# Patient Record
Sex: Female | Born: 1990 | Race: Black or African American | Hispanic: No | Marital: Single | Smoking: Former smoker
Health system: Southern US, Community
[De-identification: ages and names within clinical notes are randomized; demographics above are authoritative.]

## PROBLEM LIST (undated history)

## (undated) ENCOUNTER — Inpatient Hospital Stay (HOSPITAL_COMMUNITY): Payer: Self-pay

## (undated) DIAGNOSIS — N760 Acute vaginitis: Secondary | ICD-10-CM

## (undated) DIAGNOSIS — I219 Acute myocardial infarction, unspecified: Secondary | ICD-10-CM

## (undated) DIAGNOSIS — F419 Anxiety disorder, unspecified: Secondary | ICD-10-CM

## (undated) DIAGNOSIS — A749 Chlamydial infection, unspecified: Secondary | ICD-10-CM

## (undated) DIAGNOSIS — R7989 Other specified abnormal findings of blood chemistry: Secondary | ICD-10-CM

## (undated) DIAGNOSIS — B9689 Other specified bacterial agents as the cause of diseases classified elsewhere: Secondary | ICD-10-CM

## (undated) HISTORY — PX: CARDIAC CATHETERIZATION: SHX172

## (undated) HISTORY — PX: WISDOM TOOTH EXTRACTION: SHX21

## (undated) HISTORY — DX: Other specified abnormal findings of blood chemistry: R79.89

## (undated) HISTORY — DX: Acute myocardial infarction, unspecified: I21.9

## (undated) HISTORY — PX: ANKLE FRACTURE SURGERY: SHX122

---
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
## 2009-01-05 ENCOUNTER — Ambulatory Visit: Payer: Self-pay | Admitting: Family Medicine

## 2009-01-12 ENCOUNTER — Ambulatory Visit: Payer: Self-pay | Admitting: Family Medicine

## 2009-01-12 ENCOUNTER — Encounter (INDEPENDENT_AMBULATORY_CARE_PROVIDER_SITE_OTHER): Payer: Self-pay | Admitting: Family Medicine

## 2009-01-12 LAB — CONVERTED CEMR LAB
Chlamydia, DNA Probe: NEGATIVE
Whiff Test: NEGATIVE

## 2009-01-13 ENCOUNTER — Encounter (INDEPENDENT_AMBULATORY_CARE_PROVIDER_SITE_OTHER): Payer: Self-pay | Admitting: Family Medicine

## 2009-09-09 ENCOUNTER — Emergency Department (HOSPITAL_COMMUNITY): Admission: EM | Admit: 2009-09-09 | Discharge: 2009-09-09 | Payer: Self-pay | Admitting: Emergency Medicine

## 2009-10-25 ENCOUNTER — Encounter (INDEPENDENT_AMBULATORY_CARE_PROVIDER_SITE_OTHER): Payer: Self-pay | Admitting: *Deleted

## 2009-10-25 DIAGNOSIS — F17200 Nicotine dependence, unspecified, uncomplicated: Secondary | ICD-10-CM

## 2010-05-19 ENCOUNTER — Ambulatory Visit: Payer: Self-pay | Admitting: Family Medicine

## 2010-05-19 ENCOUNTER — Encounter: Payer: Self-pay | Admitting: Family Medicine

## 2010-05-19 DIAGNOSIS — N760 Acute vaginitis: Secondary | ICD-10-CM | POA: Insufficient documentation

## 2010-05-19 LAB — CONVERTED CEMR LAB: Chlamydia, DNA Probe: NEGATIVE

## 2010-05-20 ENCOUNTER — Telehealth: Payer: Self-pay | Admitting: *Deleted

## 2010-05-20 LAB — CONVERTED CEMR LAB

## 2010-06-30 ENCOUNTER — Encounter: Payer: Self-pay | Admitting: Family Medicine

## 2010-06-30 DIAGNOSIS — N946 Dysmenorrhea, unspecified: Secondary | ICD-10-CM

## 2010-08-04 ENCOUNTER — Ambulatory Visit: Payer: Self-pay | Admitting: Family Medicine

## 2010-10-18 ENCOUNTER — Ambulatory Visit: Payer: Self-pay | Admitting: Family Medicine

## 2010-10-18 ENCOUNTER — Ambulatory Visit (HOSPITAL_COMMUNITY)
Admission: RE | Admit: 2010-10-18 | Discharge: 2010-10-18 | Payer: Self-pay | Source: Home / Self Care | Admitting: Family Medicine

## 2010-10-18 ENCOUNTER — Encounter: Payer: Self-pay | Admitting: Family Medicine

## 2010-10-18 DIAGNOSIS — R079 Chest pain, unspecified: Secondary | ICD-10-CM | POA: Insufficient documentation

## 2010-10-18 LAB — CONVERTED CEMR LAB: Beta hcg, urine, semiquantitative: NEGATIVE

## 2010-11-04 ENCOUNTER — Ambulatory Visit: Admit: 2010-11-04 | Payer: Self-pay

## 2010-11-12 ENCOUNTER — Emergency Department (HOSPITAL_COMMUNITY)
Admission: EM | Admit: 2010-11-12 | Discharge: 2010-11-12 | Payer: Self-pay | Source: Home / Self Care | Admitting: Emergency Medicine

## 2010-11-15 LAB — URINALYSIS, ROUTINE W REFLEX MICROSCOPIC
Bilirubin Urine: NEGATIVE
Ketones, ur: NEGATIVE mg/dL
Nitrite: NEGATIVE
Protein, ur: NEGATIVE mg/dL
Specific Gravity, Urine: 1.03 (ref 1.005–1.030)
Urine Glucose, Fasting: NEGATIVE mg/dL
Urobilinogen, UA: 0.2 mg/dL (ref 0.0–1.0)
pH: 5.5 (ref 5.0–8.0)

## 2010-11-15 LAB — POCT I-STAT, CHEM 8
BUN: 10 mg/dL (ref 6–23)
Chloride: 107 mEq/L (ref 96–112)
Creatinine, Ser: 0.8 mg/dL (ref 0.4–1.2)
Potassium: 4.7 mEq/L (ref 3.5–5.1)
Sodium: 142 mEq/L (ref 135–145)
TCO2: 26 mmol/L (ref 0–100)

## 2010-11-15 LAB — URINE MICROSCOPIC-ADD ON

## 2010-11-15 LAB — POCT PREGNANCY, URINE: Preg Test, Ur: NEGATIVE

## 2010-11-22 NOTE — Assessment & Plan Note (Signed)
Summary: depo,df   Nurse Visit   Allergies: No Known Drug Allergies  Medication Administration  Injection # 1:    Medication: Depo-Provera 150mg     Diagnosis: CONTRACEPTIVE MANAGEMENT (ICD-V25.09)    Route: IM    Site: R deltoid    Exp Date: 12/2012    Lot #: [id number]    Mfr: greenstone    Comments: next depo Dec 29 thru Jan 12 , 2012    Given by: Theresia Lo RN (August 04, 2010 11:53 AM)  Orders Added: 1)  Depo-Provera 150mg  [J1055] 2)  Admin of Injection (IM/SQ) [13244]   Medication Administration  Injection # 1:    Medication: Depo-Provera 150mg     Diagnosis: CONTRACEPTIVE MANAGEMENT (ICD-V25.09)    Route: IM    Site: R deltoid    Exp Date: 12/2012    Lot #: [id number]    Mfr: greenstone    Comments: next depo Dec 29 thru Jan 12 , 2012    Given by: Theresia Lo RN (August 04, 2010 11:53 AM)  Orders Added: 1)  Depo-Provera 150mg  [J1055] 2)  Admin of Injection (IM/SQ) [53664]

## 2010-11-22 NOTE — Assessment & Plan Note (Signed)
Summary: CPE w/ pap, STD check, Bacterial Vaginosis.    Vital Signs:  Patient profile:   20 year old female Height:      61 inches Weight:      126.1 pounds BMI:     23.91 Temp:     98.8 degrees F oral Pulse rate:   89 / minute BP sitting:   115 / 72  (left arm) Cuff size:   regular  Vitals Entered By: Garen Grams LPN (May 19, 2010 11:20 AM) CC: cpe/pap/std check/depo Is Patient Diabetic? No Pain Assessment Patient in pain? no        Primary Care Provider:  Jamie Brookes MD  CC:  cpe/pap/std check/depo.  History of Present Illness: Pt comes in for a complete physcial and STD check.  She is currently working and is planning to return to school in the fall. She is in a new relationship with a female whom she is sexually active with. She is using condoms but says that it has broken once and she wants to be tested for STD's. She is currently using Depo shots as her method of contraception but wants to think about another options as this is the 3rd year she has been on this method and there is concern for osteoporosis the longer she stays on it.  She is still smoking occasionally (bumming them off of friends but not buying packs), she is smoking MJ about 2x a day, she is getting drunk at least once a week. No concerns with illness, fevers, chills, shortness of breath, chest pain. Pt does say that she "jerks" when she is falling asleep and sometimes wakes up gasping. Has been told that she snores.   Habits & Providers  Alcohol-Tobacco-Diet     Alcohol drinks/day: <1     Tobacco Status: current     Tobacco Counseling: to quit use of tobacco products     Cigarette Packs/Day: <0.25  Exercise-Depression-Behavior     Does Patient Exercise: no     Exercise Counseling: to improve exercise regimen     STD Risk: current     STD Risk Counseling: to avoid increased STD risk     Contraception Counseling: questions answered     Drug Use: marijuanna  Comments: pt smokes MJ  2x/day  Current Medications (verified): 1)  Depo Inj. .... Once Every 3 Months  Allergies (verified): No Known Drug Allergies  Social History: Lives with grandmother.  3 cigarette/day.  EtOH 2x/week multiple drinks and getting drunk.  Marajuana 2x/day.  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 Packs/Day:  <0.25 Does Patient Exercise:  no Drug Use:  marijuanna STD Risk:  current  Review of Systems         vitals reviewed and pertinent negatives and positives seen in HPI   Physical Exam  General:  Well-developed,well-nourished,in no acute distress; alert,appropriate and cooperative throughout examination Head:  Normocephalic and atraumatic without obvious abnormalities. No apparent alopecia or balding. Eyes:  No corneal or conjunctival inflammation noted. EOMI. Perrla. Funduscopic exam benign, without hemorrhages, exudates or papilledema. Vision grossly normal. wears glasses Ears:  External ear exam shows no significant lesions or deformities.  Otoscopic examination reveals clear canals, tympanic membranes are intact bilaterally without bulging, retraction, inflammation or discharge. Hearing is grossly normal bilaterally. Nose:  External nasal examination shows no deformity or inflammation. Nasal mucosa are pink and moist without lesions or exudates. Mouth:  Oral mucosa and oropharynx without lesions or exudates.  Teeth in good repair. Neck:  No deformities, masses, or tenderness noted. Breasts:  No mass, nodules, thickening, tenderness, bulging, retraction, inflamation, nipple discharge or skin changes noted.  Bilateral nipple piercing Lungs:  Normal respiratory effort, chest expands symmetrically. Lungs are clear to auscultation, no crackles or wheezes. Heart:  Normal rate and regular rhythm. S1 and S2 normal without gallop, murmur, click, rub or other extra sounds. Abdomen:  Bowel sounds positive,abdomen soft and non-tender without masses, organomegaly or hernias noted. Genitalia:   Normal introitus for age, no external lesions, some vaginal discharge, mucosa pink and moist, no vaginal or cervical lesions, no vaginal atrophy, no friaility or hemorrhage, normal uterus size and position, no adnexal masses or tenderness Msk:  No deformity or scoliosis noted of thoracic or lumbar spine.   Extremities:  No clubbing, cyanosis, edema, or deformity noted with normal full range of motion of all joints.   Skin:  Intact without suspicious lesions or rashes, some infected leg hairs where she shaves Psych:  Cognition and judgment appear intact. Alert and cooperative with normal attention span and concentration. No apparent delusions, illusions, hallucinations   Impression & Recommendations:  Problem # 1:  HEALTH MAINTENANCE EXAM (ICD-V70.0) Assessment Unchanged No new major illnesses. Exam is normal except some vaginal discharge which was found to be bacterial vaginosis. Will plan to treat with Metronidazole.   Orders: [facility] - Est  18-39 yrs (16109)  Problem # 2:  CONTRACEPTIVE MANAGEMENT (ICD-V25.09) Assessment: Unchanged Pt is not pregnant. She recieved her Depo today. Discussed contraception. She is considering Nuvaring vs Implanon.   Orders: U Preg-[facility] (60454) Depo-Provera 150mg  (J1055) [facility] - Est  18-39 yrs (09811)  Problem # 3:  BACTERIAL VAGINITIS (ICD-616.10) Assessment: New Pt found to have bacterial vaginosis. Plan to treat with Metronidazole.   Her updated medication list for this problem includes:    Metronidazole 500 Mg Tabs (Metronidazole) .Marland Kitchen... Take all 4 pills at the same time to treat bacterial vaginosis.  Orders: Wet Prep- [facility] [phone]) GC/Chlamydia-[facility] (87591/87491) [facility] - Est  18-39 yrs (29562)  Medications Added to Medication List This Visit: 1)  Depo Inj.  .... Once every 3 months 2)  Metronidazole 500 Mg Tabs (Metronidazole) .... Take all 4 pills at the same time to treat bacterial vaginosis.  Complete Medication List: 1)  Depo Inj.  .... Once every  3 months 2)  Metronidazole 500 Mg Tabs (Metronidazole) .... Take all 4 pills at the same time to treat bacterial vaginosis.  Other Orders: RPR-[facility] ([id number]) HIV-[facility] ([id number])  Patient Instructions: 1)  Your exam today was normal. 2)  We did a wet prep and STD testing. i will call you with results.  3)  you are getting your depo shot today.  Prescriptions: METRONIDAZOLE 500 MG TABS (METRONIDAZOLE) take all 4 pills at the same time to treat bacterial vaginosis.  #4 x 0   Entered and Authorized by:   Jamie Brookes MD   Signed by:   Jamie Brookes MD on 05/20/2010   Method used:   Electronically to        [facility] [phone]* (retail)       [address]       [place], [place]  [address]       Ph: [phone]       Fax: [phone]   RxID:   [id number]    Medication Administration  Injection # 1:    Medication: Depo-Provera 150mg     Diagnosis: CONTRACEPTIVE MANAGEMENT (ICD-V25.09)    Route: IM    Site:  L deltoid    Exp Date: 01/22/2012    Lot #: [id number]    Mfr: Pfizer    Comments: Next Depo Due: October 13 - October 27    Patient tolerated injection without complications    Given by: Garen Grams LPN (May 19, 2010 12:18 PM)  Orders Added: 1)  RPR-[facility] [phone] 2)  U Preg-[facility] [81025] 3)  Depo-Provera 150mg  [J1055] 4)  Wet Prep- [facility] [87210] 5)  GC/Chlamydia-[facility] [87591/87491] 6)  HIV-[facility] [[id number]] 7)  [facility] - Est  18-39 yrs [99395]  Laboratory Results   Urine Tests  Date/Time Received: May 19, 2010 12:01 PM  Date/Time Reported: May 19, 2010 12:25 PM     Urine HCG: negative Comments: ...............test performed by......Marland KitchenBonnie A. [place], MLS (ASCP)cm  Date/Time Received: May 19, 2010 12:01 PM  Date/Time Reported: May 19, 2010 12:24 PM   Wet [place] Source: vag WBC/hpf: 10-20 Bacteria/hpf: 3+  Cocci Clue cells/hpf: many  Positive whiff Yeast/hpf: few Trichomonas/hpf: none Comments: ...............test performed  by......Marland KitchenBonnie A. [place], MLS (ASCP)cm

## 2010-11-22 NOTE — Miscellaneous (Signed)
Summary: Tobacco Selena Peterson  Clinical Lists Changes  Problems: Added new problem of TOBACCO Selena Peterson (ICD-305.1) 

## 2010-11-22 NOTE — Progress Notes (Signed)
   Phone Note Outgoing Call   Call placed by: Jimmy Footman, CMA,  May 20, 2010 1:56 PM Call placed to: Patient Summary of Call: LVM for pt to call back about her labs.Jimmy Footman, CMA  May 20, 2010 1:56 PM   Follow-up for Phone Call        called and lvm for pt to return call Follow-up by: Loralee Pacas CMA,  May 23, 2010 8:51 AM  Additional Follow-up for Phone Call Additional follow up Details #1::        Phone call completed Additional Follow-up by: Jimmy Footman, CMA,  May 23, 2010 9:26 AM

## 2010-11-22 NOTE — Miscellaneous (Signed)
Summary: c/o prolonged period on Depo   Clinical Lists Changes states she has bled or spotted x 4 weeks. had depo 05/19/10. wants it to stop. told her the depo is in her system ans some women have this when they first get depo. her cycle should get better or she might have no bleeding after 1 or 2 more shots.  next shot due 10/13. told her she can either get it again & that this should even out & stop or may choose another method. to pcp. told pt I will call her if md wants her to come in sooner.Golden Circle RN  June 30, 2010 10:42 AM  If the flow is heavy enough that she is starting to become anemic this is a problem. Otherwise I agree with your plan. If the patient begins to feel weak, SOB or fatigued have her come in to get a CBC. I will put a future order for a CBC in case she starts feeling that way. Please let her know.  Jamie Brookes MD  July 01, 2010 2:31 PM  LM.Marland KitchenGolden Circle RN  July 04, 2010 10:22 AM  pt returned call De Nurse  July 04, 2010 10:33 AM  states the bleeding has stopped. asked that if she gets any symptoms as above, call us. when she is here for next depo, make an appt with md to discuss. she agreed.Golden Circle RN  July 04, 2010 11:34 AM  Problems: Added new problem of IRREGULAR MENSES (ICD-626.4) Orders: Added new Test order of CBC-[facility] (16109) - Signed

## 2010-11-23 ENCOUNTER — Encounter: Payer: Self-pay | Admitting: *Deleted

## 2010-11-24 NOTE — Assessment & Plan Note (Signed)
Summary: chest pain, IMPLANON INSERTION, ankle rash   Vital Signs:  Patient profile:   20 year old female Weight:      129 pounds Temp:     98.4 degrees F oral Pulse rate:   85 / minute Pulse rhythm:   regular BP sitting:   132 / 84  (left arm) Cuff size:   regular  Vitals Entered By: Loralee Pacas CMA (October 18, 2010 9:56 AM)  Primary Care Provider:  Jamie Brookes MD  CC:  Chest pain, implanon, and ankle rash. .  History of Present Illness: Chest pain: Pt has been having chest pain on and off for the last 1 month. She says it hurts worse when she is at work and when she caves her chest inward. She is not sleeping differently, she is not stressed more than usual at work. She sits at her job and has not been exercising more than normal. She says she was worried that it was being caused by the black and mild cigars she has been smoking so she has cut back on those in the last 1 week and has not had any chest pain in the last 1 week. No fevers, coughs, or chills. Pt is not currently having chest pain.   Implanon: Dominence of hand: Right Placement of Implanon: left Placement determined using ruler. insertion was 8 cm from medial epicondyle. marks made on skin at site of insertion.  skin was cleaned w/ alcohol, 5 cc of 1% lidocaine w/o epi was inserted under skin.  the skin was prepped using 3 betadine swabs sterile prodedure was used.  The implanon was inserted without complication. 2 Steri strips placed once skin was prepped w/ Benzine tincture Compression bandage was placed Implanon palpated by myself & patient  Rash on Ankle: Pt has a rash on her ankles that has been there on and off for 15 years. She has tried Triamcinolone cream for the rash but says that it only gets rid of the redness and itching but the rash still is present. She admits to popping the vesicles that arise from the rash. Wants referral to Derm.    Habits & Providers  Alcohol-Tobacco-Diet     Tobacco  Status: current     Tobacco Counseling: to quit use of tobacco products  Current Medications (verified): 1)  Implanon 68 Mg Impl (Etonogestrel) .... Inserted 10-18-10, To Be Removed 10-18-2013  Allergies (verified): No Known Drug Allergies  Review of Systems        vitals reviewed and pertinent negatives and positives seen in HPI   Physical Exam  General:  Well-developed,well-nourished,in no acute distress; alert,appropriate and cooperative throughout examination Head:  Normocephalic and atraumatic without obvious abnormalities. No apparent alopecia or balding. Eyes:  No corneal or conjunctival inflammation noted. EOMI. Perrla.  Vision grossly normal. Lungs:  Normal respiratory effort, chest expands symmetrically. Lungs are clear to auscultation, no crackles or wheezes. Heart:  Normal rate and regular rhythm. S1 and S2 normal without gallop, murmur, click, rub or other extra sounds. Msk:  left arm placement for implanon marked based on biceps/triceps  groove   Impression & Recommendations:  Problem # 1:  CHEST PAIN UNSPECIFIED (ICD-786.50) Assessment New Pt has had chest pain as she was smoking more Black and Milds. She has slowed on her smoking the last 1 week and has not had any chest pain the last 1 week. She had an EKG today. rate 61, normal axis, sinus rhythm, no T wave abnormalities, final overread to  come.  I suspect this is due to chest pain from early bronchitis (pt had some occasional coughing) created by her smoking. Advised not to smoke.   Orders: 12 Lead EKG (12 Lead EKG) [facility]- Est  Level 4 (56213)  Problem # 2:  SKIN RASH (ICD-782.1) Assessment: Deteriorated Pt says she has had this for 15 years. She would like to see a dermatologist. I agree. This will likely need a biopsy but it is in a very thin skinned area and I will defer to derm to do the biopsy.   Derm Referral  Problem # 3:  CONTRACEPTIVE MANAGEMENT (ICD-V25.09) Assessment: New Implanon inserted today  after consent obtained. See procedure note for specifics.   Orders: U Preg-[facility] (08657) Insertion, non biodegradable drug deliver implant, (84696)  Complete Medication List: 1)  Implanon 68 Mg Impl (Etonogestrel) .... Inserted 10-18-10, to be removed 10-18-2013  Other Orders: Dermatology Referral [facility])  Patient Instructions: 1)  Pt given verbal instructions: 2)  take pressure dressing off in 2 hours, don't get it wet today, can wash with regular soap tomorrow, leave steri-strips on till they fall off. Call with with questions or concerns.    Orders Added: 1)  U Preg-[facility] [81025] 2)  12 Lead EKG [12 Lead EKG] 3)  Dermatology Referral [Derma] 4)  [facility]- Est  Level 4 [29528] 5)  Insertion, non biodegradable drug deliver implant, [41324]    Laboratory Results   Urine Tests  Date/Time Received: October 18, 2010 10:18 AM  Date/Time Reported: October 18, 2010 10:25 AM     Urine HCG: negative Comments: ...............test performed by......Marland KitchenBonnie A. [place], MLS (ASCP)cm     Appended Document: Orders Update     Clinical Lists Changes  Problems: Added new problem of INSERTION OF IMPLANTABLE SUBDERMAL CONTRACEPTIVE (ICD-V25.5) Added new problem of SURVEILLANCE PREV PRSC IMPL SUBDERMAL CONTRACEPT (ICD-V25.43)

## 2010-11-24 NOTE — Miscellaneous (Signed)
Summary: Consent: implanon insertion  Consent: implanon insertion   Imported By: Knox Royalty 10/25/2010 16:04:13  _____________________________________________________________________  External Attachment:    Type:   Image     Comment:   External Document

## 2011-06-13 ENCOUNTER — Ambulatory Visit: Payer: Self-pay | Admitting: Family Medicine

## 2011-07-17 ENCOUNTER — Ambulatory Visit: Payer: Self-pay | Admitting: Family Medicine

## 2011-08-17 ENCOUNTER — Emergency Department (HOSPITAL_COMMUNITY)
Admission: EM | Admit: 2011-08-17 | Discharge: 2011-08-17 | Disposition: A | Discharge status: Home / Self Care | Payer: Self-pay | Attending: Emergency Medicine | Admitting: Emergency Medicine

## 2011-08-17 DIAGNOSIS — W57XXXA Bitten or stung by nonvenomous insect and other nonvenomous arthropods, initial encounter: Secondary | ICD-10-CM | POA: Insufficient documentation

## 2011-08-17 DIAGNOSIS — R21 Rash and other nonspecific skin eruption: Secondary | ICD-10-CM | POA: Insufficient documentation

## 2011-08-17 DIAGNOSIS — L299 Pruritus, unspecified: Secondary | ICD-10-CM | POA: Insufficient documentation

## 2011-08-17 DIAGNOSIS — T148 Other injury of unspecified body region: Secondary | ICD-10-CM | POA: Insufficient documentation

## 2011-10-25 ENCOUNTER — Telehealth (HOSPITAL_COMMUNITY): Payer: Self-pay | Admitting: Emergency Medicine

## 2011-10-25 ENCOUNTER — Emergency Department (INDEPENDENT_AMBULATORY_CARE_PROVIDER_SITE_OTHER)
Admission: EM | Admit: 2011-10-25 | Discharge: 2011-10-25 | Disposition: A | Discharge status: Home / Self Care | Payer: Self-pay | Source: Home / Self Care | Attending: Emergency Medicine | Admitting: Emergency Medicine

## 2011-10-25 ENCOUNTER — Encounter: Payer: Self-pay | Admitting: Emergency Medicine

## 2011-10-25 DIAGNOSIS — N390 Urinary tract infection, site not specified: Secondary | ICD-10-CM

## 2011-10-25 DIAGNOSIS — R300 Dysuria: Secondary | ICD-10-CM

## 2011-10-25 HISTORY — DX: Chlamydial infection, unspecified: A74.9

## 2011-10-25 HISTORY — DX: Other specified bacterial agents as the cause of diseases classified elsewhere: N76.0

## 2011-10-25 HISTORY — DX: Other specified bacterial agents as the cause of diseases classified elsewhere: B96.89

## 2011-10-25 LAB — POCT URINALYSIS DIP (DEVICE)
Bilirubin Urine: NEGATIVE
Ketones, ur: NEGATIVE mg/dL
Protein, ur: NEGATIVE mg/dL
Specific Gravity, Urine: 1.015 (ref 1.005–1.030)
pH: 6 (ref 5.0–8.0)

## 2011-10-25 LAB — WET PREP, GENITAL
Trich, Wet Prep: NONE SEEN
Yeast Wet Prep HPF POC: NONE SEEN

## 2011-10-25 MED ORDER — SULFAMETHOXAZOLE-TRIMETHOPRIM 800-160 MG PO TABS: 1.0000 | ORAL_TABLET | Freq: Two times a day (BID) | ORAL | Status: AC

## 2011-10-25 MED ORDER — PHENAZOPYRIDINE HCL 200 MG PO TABS: 200.0000 mg | ORAL_TABLET | Freq: Three times a day (TID) | ORAL | Status: AC | PRN

## 2011-10-25 NOTE — ED Notes (Signed)
Did not assist with pelvic exam

## 2011-10-25 NOTE — ED Provider Notes (Signed)
History     CSN: [id number]  Arrival date & time 10/25/11  8469   First MD Initiated Contact with Patient 10/25/11 1103      Chief Complaint  Patient presents with  . Urinary Tract Infection    (Consider location/radiation/quality/duration/timing/severity/associated sxs/prior treatment) HPI Comments: Pt with  urgency, frequency, dysuria starting last night. Lower abd pressure at end of urinary stream.  States that she holds her urine and did not urinate after intercourse for the last 2 times. No vaginal discharge, oderous urine, hematuria,  genital blisters, vaginal itching. No fevers, N/V, back pain. No recent abx use. Pt sexually active with same female partner who is asxatic.  does not use condoms. Would like to be checked for STD's as well. . H/o chlamydia, BV.  No h/o UTI, gonorrhea  trichmonoas yeast infection. No h/o syphilis, herpes, HIV.    Patient is a 21 y.o. female presenting with urinary tract infection.  Urinary Tract Infection Pertinent negatives include no abdominal pain.    Past Medical History  Diagnosis Date  . Chlamydia   . BV (bacterial vaginosis)     Past Surgical History  Procedure Date  . Ankle fracture surgery     History reviewed. No pertinent family history.  History  Substance Use Topics  . Smoking status: Current Everyday Smoker  . Smokeless tobacco: Not on file  . Alcohol Use: Yes    OB History    Grav Para Term Preterm Abortions TAB SAB Ect Mult Living                  Review of Systems  Constitutional: Negative for fever.  Gastrointestinal: Negative for nausea, vomiting and abdominal pain.  Genitourinary: Positive for dysuria, urgency and frequency. Negative for hematuria, flank pain, vaginal bleeding, vaginal discharge and vaginal pain.  Musculoskeletal: Negative for back pain.  Skin: Negative for rash.    Allergies  Review of patient's allergies indicates no known allergies.  Home Medications   Current Outpatient Rx  Name  Route Sig Dispense Refill  . ETONOGESTREL 68 MG [place] IMPL  Inserted 10-18-10, to be removed 10-18-2013     . PHENAZOPYRIDINE HCL 200 MG PO TABS Oral Take 1 tablet (200 mg total) by mouth 3 (three) times daily as needed for pain. 6 tablet 0  . SULFAMETHOXAZOLE-TRIMETHOPRIM 800-160 MG PO TABS Oral Take 1 tablet by mouth 2 (two) times daily. 6 tablet 0    BP 121/68  Pulse 86  Temp(Src) 98.2 F (36.8 C) (Oral)  Resp 18  SpO2 100%  LMP 10/12/2011  Physical Exam  Nursing note and vitals reviewed. Constitutional: She is oriented to person, place, and time. She appears well-developed and well-nourished. No distress.  HENT:  Head: Normocephalic and atraumatic.  Eyes: EOM are normal. Pupils are equal, round, and reactive to light.  Neck: Normal range of motion. Neck supple.  Cardiovascular: Normal rate, regular rhythm and normal heart sounds.   Pulmonary/Chest: Effort normal and breath sounds normal.  Abdominal: Soft. Bowel sounds are normal. She exhibits no distension. There is no tenderness. There is no rebound, no guarding and no CVA tenderness.  Genitourinary: Uterus normal. Pelvic exam was performed with patient supine. There is no rash on the right labia. There is no rash on the left labia. Uterus is not tender. Cervix exhibits no motion tenderness and no friability. Right adnexum displays no mass, no tenderness and no fullness. Left adnexum displays no mass, no tenderness and no fullness. No erythema, tenderness or bleeding  around the vagina. No foreign body around the vagina.       nonoderous Yellowish brown vaginal d/c. Pt states just finshed menses .Chaperone present during exam  Musculoskeletal: Normal range of motion.  Neurological: She is alert and oriented to person, place, and time.  Skin: Skin is warm and dry.  Psychiatric: She has a normal mood and affect. Her behavior is normal. Judgment and thought content normal.    ED Course  Procedures (including critical care time)  Labs  Reviewed  POCT URINALYSIS DIP (DEVICE) - Abnormal; Notable for the following:    Hgb urine dipstick SMALL (*)    Nitrite POSITIVE (*)    Leukocytes, UA TRACE (*) Biochemical Testing Only. Please order routine urinalysis from main lab if confirmatory testing is needed.   All other components within normal limits  POCT PREGNANCY, URINE  POCT PREGNANCY, URINE  POCT URINALYSIS DIPSTICK  WET PREP, GENITAL  GC/CHLAMYDIA PROBE AMP, GENITAL   No results found.   1. UTI (lower urinary tract infection)       MDM  Previous chart, labs, imaging reviewed. As noted in HPI  H&P most c/w  UTI. Sent off GC/chlamydia, wet prep. Will not treat empirically now. Will send home with abx for uti. Advised pt to refrain from sexual contact until she  knows lab results, symptoms resolve, and partner(s) are treated if necessary. Pt provided working phone number. Pt agrees.      Luiz Blare, MD 10/25/11 1243

## 2011-10-25 NOTE — ED Notes (Signed)
Pt. verified and notified of results. Rx. called as ordered to [facility] on [address] ( see telephone encounter).

## 2011-10-25 NOTE — ED Notes (Signed)
   Luiz Blare, MD More Detail >>      Luiz Blare, MD        Sent: Wed October 25, 2011  5:10 PM    To: Vassie Moselle, RN        NATHIFA RITTHALER    MRN: [id number] DOB: [birth year]     Pt Work: [phone] Pt Home: [phone]           Message     I cant figure out how to do a telephone order. pls call in flagyl 500 mg po bid x 7 days no refills Thanks  copied message from Dr. Ozella Rocks, Desiree Lucy 10/25/2011

## 2011-10-25 NOTE — Telephone Encounter (Signed)
Message copied by Domenick Gong on Wed Oct 25, 2011  5:09 PM ------      Message from: Vassie Moselle      Created: Wed Oct 25, 2011  4:44 PM       Error prev. note. It was mod. clue cells, WBC's TNTC.      Cherly Anderson M

## 2011-10-25 NOTE — ED Notes (Signed)
Noticed pressure at the end of urinary stream noticed yesterday.  Has increased water intake and pressure has improved.  Patient also concerned for std.  Denies vaginal discharge.

## 2011-10-26 NOTE — ED Notes (Signed)
GC neg., Chlamydia neg., Wet prep: mod. Clue cells, WBC's TNTC.

## 2012-04-17 ENCOUNTER — Ambulatory Visit: Payer: Self-pay | Admitting: Family Medicine

## 2012-04-22 ENCOUNTER — Ambulatory Visit: Payer: Self-pay | Admitting: Family Medicine

## 2012-07-15 ENCOUNTER — Encounter: Payer: Self-pay | Admitting: Family Medicine

## 2012-07-15 ENCOUNTER — Ambulatory Visit (INDEPENDENT_AMBULATORY_CARE_PROVIDER_SITE_OTHER): Payer: Medicaid Other | Admitting: Family Medicine

## 2012-07-15 ENCOUNTER — Other Ambulatory Visit (HOSPITAL_COMMUNITY)
Admission: RE | Admit: 2012-07-15 | Discharge: 2012-07-15 | Disposition: A | Discharge status: Home / Self Care | Payer: Medicaid Other | Source: Ambulatory Visit | Attending: Family Medicine | Admitting: Family Medicine

## 2012-07-15 VITALS — BP 137/85 | HR 81 | Temp 98.9°F | Ht 60.0 in | Wt 142.5 lb

## 2012-07-15 DIAGNOSIS — Z9189 Other specified personal risk factors, not elsewhere classified: Secondary | ICD-10-CM

## 2012-07-15 DIAGNOSIS — Z124 Encounter for screening for malignant neoplasm of cervix: Secondary | ICD-10-CM

## 2012-07-15 DIAGNOSIS — N760 Acute vaginitis: Secondary | ICD-10-CM

## 2012-07-15 DIAGNOSIS — Z202 Contact with and (suspected) exposure to infections with a predominantly sexual mode of transmission: Secondary | ICD-10-CM | POA: Insufficient documentation

## 2012-07-15 DIAGNOSIS — Z2089 Contact with and (suspected) exposure to other communicable diseases: Secondary | ICD-10-CM

## 2012-07-15 DIAGNOSIS — Z01419 Encounter for gynecological examination (general) (routine) without abnormal findings: Secondary | ICD-10-CM | POA: Insufficient documentation

## 2012-07-15 LAB — POCT WET PREP (WET MOUNT): WBC, Wet Prep HPF POC: >20

## 2012-07-15 NOTE — Assessment & Plan Note (Addendum)
Will check wet prep, GC/Chlamydia, HIV and RPR today. Counseled patient on condom use at all times.  Handout given. Due for pap today also.  Patient to schedule annual physical at her earliest convenience.

## 2012-07-15 NOTE — Progress Notes (Signed)
  Subjective:    Selena Peterson is a 21 y.o. female who presents for sexually transmitted disease check. Sexual history reviewed with the patient. STI Exposure: she has had unprotected sex with one partner in the last 2 months, but has not had any STD screening since she has been with him.  Patient has an Implanon, but does not always wear condoms.  Current symptoms vaginal discharge: white and thin.  She denies any irregular bleeding, pelvic pain, or pain with intercourse.  She denies any fever, chills, NS, nausea or vomiting.     Contraception: Implanon   Review of Systems:  Per HPI   Objective:    BP 137/85  Pulse 81  Temp 98.9 F (37.2 C) (Oral)  Ht 5' (1.524 m)  Wt 142 lb 8 oz (64.638 kg)  BMI 27.83 kg/m2 General:   alert, cooperative and no distress  Abdomen:   soft, NT, ND, no pain on palpation  Pelvis:  Vulva and vagina appear normal. Bimanual exam reveals normal uterus and adnexa. Vaginal: normal without tenderness, induration or masses and discharge, white, copious Cervix: normal appearance  Cultures:  GC and Chlamydia genprobes and wet prep     Assessment:    Possible STD exposure, vaginal discharge    Plan:    Discussed safe sexual practice in detail.  See Problem List

## 2012-07-15 NOTE — Patient Instructions (Addendum)
It was nice to meet you, Keaisha. We will call or send a letter with your lab results. ALWAYS wear condoms to prevent sexually transmitted diseases. Please return to clinic if you develop fever, chills, nausea/vomiting, or worsening symptoms. Come back for a complete physical at your earliest convenience.  Sexually Transmitted Disease A sexually transmitted disease (STD) is an infection that is passed from person to person during sexual activity. STDs can be spread by different types of germs (bacteria, viruses, parasites). An STD can be passed through:  Spit (saliva).   Semen.   Blood.   Mucus from the vagina.   Pee (urine).  HOME CARE   Tell your sex partner(s) that you have an STD. They should be tested and treated.   Take your medicine (antibiotics) as told. Finish them even if you start to feel better.   Only take medicines as told by your doctor.   Rest.   Eat a healthy diet. Drink enough fluids to keep your pee clear or pale yellow.   Do not have sex until treatment is finished. You must follow up with your doctor.   Keep all doctor visits, Pap tests, and blood tests as told by your doctor.   Only use condoms labeled "latex" and lubricants that wash away with water (water-soluble). Do not use petroleum jelly or oils.   Avoid alcohol and illegal drugs.   Get shots (vaccines) for HPV and hepatitis.   Avoid risky sex behavior that can break the skin.  GET HELP RIGHT AWAY IF:  You have a fever.   You have new problems, or your problems get worse.  MAKE SURE YOU:  Understand these instructions.   Will watch your condition.   Will get help right away if you are not doing well or get worse.  Document Released: 11/16/2004 Document Revised: 09/28/2011 Document Reviewed: 02/06/2011 [facility] Patient Information 2012 [place], [place].

## 2012-07-16 ENCOUNTER — Encounter: Payer: Medicaid Other | Admitting: Family Medicine

## 2012-07-16 LAB — HIV ANTIBODY (ROUTINE TESTING W REFLEX): HIV: NONREACTIVE

## 2012-07-23 ENCOUNTER — Telehealth: Payer: Self-pay | Admitting: Family Medicine

## 2012-07-23 NOTE — Telephone Encounter (Signed)
Please call patient and let her know that ALL results are NORMAL.  Thanks.

## 2012-07-23 NOTE — Telephone Encounter (Signed)
Spoke with patient and informed her that all the labs are normal

## 2012-08-28 ENCOUNTER — Ambulatory Visit: Payer: Medicaid Other | Admitting: Family Medicine

## 2012-09-02 ENCOUNTER — Telehealth: Payer: Self-pay | Admitting: Family Medicine

## 2012-09-02 NOTE — Telephone Encounter (Signed)
Patient is calling to find out when she is supposed to have the Emplanon removed.  She didn't know if it would be coming January or the next.

## 2012-09-02 NOTE — Telephone Encounter (Signed)
Was unable to speak with pt. Should she call please inform pt that she can have this removed 10/18/2013.Selena Peterson [place]

## 2012-09-03 NOTE — Telephone Encounter (Signed)
LVM for patient to call back. ?

## 2012-09-12 ENCOUNTER — Encounter: Payer: Medicaid Other | Admitting: Family Medicine

## 2012-09-24 ENCOUNTER — Encounter: Payer: Medicaid Other | Admitting: Family Medicine

## 2012-11-12 ENCOUNTER — Encounter: Payer: Self-pay | Admitting: Family Medicine

## 2012-11-12 ENCOUNTER — Telehealth: Payer: Self-pay | Admitting: Family Medicine

## 2012-11-12 ENCOUNTER — Ambulatory Visit (INDEPENDENT_AMBULATORY_CARE_PROVIDER_SITE_OTHER): Payer: Medicaid Other | Admitting: Family Medicine

## 2012-11-12 VITALS — BP 145/91 | HR 81 | Temp 97.8°F | Wt 143.0 lb

## 2012-11-12 DIAGNOSIS — N926 Irregular menstruation, unspecified: Secondary | ICD-10-CM

## 2012-11-12 DIAGNOSIS — R030 Elevated blood-pressure reading, without diagnosis of hypertension: Secondary | ICD-10-CM

## 2012-11-12 LAB — POCT URINE PREGNANCY: Preg Test, Ur: NEGATIVE

## 2012-11-12 MED ORDER — IBUPROFEN 800 MG PO TABS: 800.0000 mg | ORAL_TABLET | Freq: Three times a day (TID) | ORAL | Status: DC | PRN

## 2012-11-12 NOTE — Progress Notes (Signed)
  Subjective:    Patient ID: Selena Peterson, female    DOB: 1991-03-10, 22 y.o.   MRN: [id number]  HPI  21yo with implanon placed two years ago here because concerned abotu irregular menses  Had not had trouble with menses first two years of implanon.  In past few months has noted two periods per month with increased cramping.  Periods are prlonged and may last as much as 2 weeks. Improved with ibuprofen 800 mg.  No vagianl duscharge or urinary symptoms.  Review of Systems    see HPI Objective:   Physical Exam GEN: NAD Abd:  Soft, nontender. Arm:  Left upper arm implanon in place       Assessment & Plan:

## 2012-11-12 NOTE — Patient Instructions (Addendum)
Implanon due for replacement after 10/18/2013  It is common to have times of irregular periods with implanon.  This is not worrisome and will go away with time.  Will check your hemoglobin today  Will prescribed high dose ibuprofen to take for cramping and bleeding.  Cardiovascular exercise, weight loss, and a low salt diet can help you prevent complications from high blood pressure.  Normal blood pressure is less than 140/90

## 2012-11-12 NOTE — Assessment & Plan Note (Addendum)
Discussed irregular bleeding is normal with implanon.  Pregnancy test and hgb today are normal.  Advised ibuprofen 800 mg scheduled for first few days for cramping and bleeding, then prn. She declines STD screening as she feels she has had it recently.

## 2012-11-12 NOTE — Telephone Encounter (Signed)
Was supposed to have tylenol called in for her and pharmacy says they don't have it yet.  pls advise

## 2012-11-12 NOTE — Assessment & Plan Note (Addendum)
Patient would like to know how to improve as this runs in her family and she is typically has borderlien BP's.  Advised weight loss, exercise, low salt diet.  Advsied will continue to monitor at subsequent office visits.

## 2012-11-12 NOTE — Telephone Encounter (Signed)
Spoke with patient and informed of below 

## 2012-11-12 NOTE — Telephone Encounter (Signed)
Advised patient ibuprofen was sent in to pharmacy.

## 2013-02-11 ENCOUNTER — Emergency Department (HOSPITAL_COMMUNITY)
Admission: EM | Admit: 2013-02-11 | Discharge: 2013-02-11 | Disposition: A | Discharge status: Home / Self Care | Payer: Medicaid Other | Attending: Emergency Medicine | Admitting: Emergency Medicine

## 2013-02-11 ENCOUNTER — Encounter (HOSPITAL_COMMUNITY): Payer: Self-pay | Admitting: *Deleted

## 2013-02-11 DIAGNOSIS — F17200 Nicotine dependence, unspecified, uncomplicated: Secondary | ICD-10-CM | POA: Insufficient documentation

## 2013-02-11 DIAGNOSIS — R61 Generalized hyperhidrosis: Secondary | ICD-10-CM | POA: Insufficient documentation

## 2013-02-11 DIAGNOSIS — M2550 Pain in unspecified joint: Secondary | ICD-10-CM | POA: Insufficient documentation

## 2013-02-11 DIAGNOSIS — IMO0001 Reserved for inherently not codable concepts without codable children: Secondary | ICD-10-CM | POA: Insufficient documentation

## 2013-02-11 DIAGNOSIS — I10 Essential (primary) hypertension: Secondary | ICD-10-CM | POA: Insufficient documentation

## 2013-02-11 DIAGNOSIS — J3501 Chronic tonsillitis: Secondary | ICD-10-CM | POA: Insufficient documentation

## 2013-02-11 DIAGNOSIS — Z8619 Personal history of other infectious and parasitic diseases: Secondary | ICD-10-CM | POA: Insufficient documentation

## 2013-02-11 DIAGNOSIS — R509 Fever, unspecified: Secondary | ICD-10-CM | POA: Insufficient documentation

## 2013-02-11 DIAGNOSIS — J039 Acute tonsillitis, unspecified: Secondary | ICD-10-CM

## 2013-02-11 LAB — GLUCOSE, CAPILLARY: Glucose-Capillary: 97 mg/dL (ref 70–99)

## 2013-02-11 MED ORDER — AMOXICILLIN 500 MG PO CAPS: 500.0000 mg | ORAL_CAPSULE | Freq: Three times a day (TID) | ORAL | Status: DC

## 2013-02-11 NOTE — ED Provider Notes (Signed)
History     CSN: [id number]  Arrival date & time 02/11/13  0915   First MD Initiated Contact with Patient 02/11/13 [phone]      Chief Complaint  Patient presents with  . Sore Throat    (Consider location/radiation/quality/duration/timing/severity/associated sxs/prior treatment) HPI Comments: Selena Peterson is a 22 y.o. Female who presents for evaluation of sore throat. She has moderate pain in her throat. It hurts worse with swallowing. She has associated fever, chills, diaphoresis, myalgias and arthralgias. She did not take any medication for the problem. She denies headache, weakness, dizziness. She's had a single episode of vomiting. She was unable to work today because of the discomfort. She has a history of borderline elevated blood pressure.  Patient is a 22 y.o. female presenting with pharyngitis. The history is provided by the patient.  Sore Throat    Past Medical History  Diagnosis Date  . Chlamydia   . BV (bacterial vaginosis)     Past Surgical History  Procedure Laterality Date  . Ankle fracture surgery      History reviewed. No pertinent family history.  History  Substance Use Topics  . Smoking status: Current Every Day Smoker -- 0.30 packs/day    Types: Cigarettes  . Smokeless tobacco: Not on file  . Alcohol Use: Yes    OB History   Grav Para Term Preterm Abortions TAB SAB Ect Mult Living                  Review of Systems  All other systems reviewed and are negative.    Allergies  Review of patient's allergies indicates no known allergies.  Home Medications   Current Outpatient Rx  Name  Route  Sig  Dispense  Refill  . aspirin 325 MG tablet   Oral   Take 325 mg by mouth every 4 (four) hours as needed for pain.         Marland Kitchen DM-Phenylephrine-Acetaminophen (TYLENOL COLD MULTI-SYMPTOM) 10-5-325 MG/15ML LIQD   Oral   Take 15-30 mLs by mouth every 6 (six) hours as needed (cough).         . Etonogestrel (IMPLANON) 68 MG IMPL   Subcutaneous  Inject 1 each into the skin once. Inserted 10-18-10, to be removed 10-18-2013         . amoxicillin (AMOXIL) 500 MG capsule   Oral   Take 1 capsule (500 mg total) by mouth 3 (three) times daily.   30 capsule   0   . diphenhydrAMINE (BENADRYL) 25 MG tablet   Oral   Take 25 mg by mouth every 6 (six) hours as needed for itching or allergies.           BP 150/98  Pulse 105  Temp(Src) 98.4 F (36.9 C) (Oral)  Resp 16  SpO2 100%  Physical Exam  Nursing note and vitals reviewed. Constitutional: She is oriented to person, place, and time. She appears well-developed and well-nourished.  HENT:  Head: Normocephalic and atraumatic.  Bilateral tonsillar hypertrophy with exudate. No peritonsillar swelling. Airway is intact.  Eyes: Conjunctivae and EOM are normal. Pupils are equal, round, and reactive to light.  Neck: Normal range of motion and phonation normal. Neck supple.  Cardiovascular: Normal rate, regular rhythm and intact distal pulses.   Pulmonary/Chest: Effort normal and breath sounds normal. She exhibits no tenderness.  No stridor  Abdominal: Soft. She exhibits no distension. There is no tenderness. There is no guarding.  Musculoskeletal: Normal range of motion.  Neurological: She  is alert and oriented to person, place, and time. She has normal strength. She exhibits normal muscle tone.  Skin: Skin is warm and dry.  Psychiatric: She has a normal mood and affect. Her behavior is normal. Judgment and thought content normal.    ED Course  Procedures (including critical care time)    Vitals with Age-Percentiles 10/25/2011 07/15/2012 11/12/2012 02/11/2013  Height percentile      Systolic percentile      Diastolic percentile      Length  152.4 cm    Systolic 121 137 409 150  Diastolic 68 85 91 98  Pulse 86 81 81 105  Respiration 18   16  Weight  64.638 kg 64.864 kg   VISIT REPORT          Nursing Notes Reviewed/ Care Coordinated, and agree without changes. Applicable  Imaging Reviewed.  Interpretation of Laboratory Data incorporated into ED treatment   1. Tonsillitis   2. Hypertension       MDM  Clinical tonsillitis, with no complicating features. Mild elevation associated with painful condition. I did not suspect hypertensive urgency. Doubt metabolic instability, serious bacterial infection or impending vascular collapse; the patient is stable for discharge.    Plan: Home Medications- Amoxicillin ; Home Treatments- rest, fluids; Recommended follow up- PCP for check up in 2 weeks          Flint Melter, MD 02/11/13 [phone]

## 2013-02-11 NOTE — ED Notes (Addendum)
Pt reports being dehydrated starting Sunday. Sore throat and flu like symptoms, sore throat, chills, joint pain, vomited once last night. Pain 8/10.  Pt reports having feelings of urinary frequency. Has moments when pt feels like she is going to pass out.

## 2013-03-05 ENCOUNTER — Encounter: Payer: Self-pay | Admitting: *Deleted

## 2013-03-05 ENCOUNTER — Ambulatory Visit (INDEPENDENT_AMBULATORY_CARE_PROVIDER_SITE_OTHER): Payer: PRIVATE HEALTH INSURANCE | Admitting: Emergency Medicine

## 2013-03-05 VITALS — BP 138/70 | HR 98 | Temp 97.9°F | Resp 16 | Ht 61.5 in | Wt 145.2 lb

## 2013-03-05 DIAGNOSIS — J018 Other acute sinusitis: Secondary | ICD-10-CM

## 2013-03-05 DIAGNOSIS — K219 Gastro-esophageal reflux disease without esophagitis: Secondary | ICD-10-CM

## 2013-03-05 MED ORDER — AMOXICILLIN-POT CLAVULANATE 875-125 MG PO TABS: 1.0000 | ORAL_TABLET | Freq: Two times a day (BID) | ORAL | Status: DC

## 2013-03-05 MED ORDER — PSEUDOEPHEDRINE-GUAIFENESIN ER 60-600 MG PO TB12: 1.0000 | ORAL_TABLET | Freq: Two times a day (BID) | ORAL | Status: AC

## 2013-03-05 MED ORDER — ESOMEPRAZOLE MAGNESIUM 40 MG PO CPDR: 40.0000 mg | DELAYED_RELEASE_CAPSULE | Freq: Every day | ORAL | Status: DC

## 2013-03-05 NOTE — Patient Instructions (Addendum)
Diet for Gastroesophageal Reflux Disease, Adult  Reflux (acid reflux) is when acid from your stomach flows up into the esophagus. When acid comes in contact with the esophagus, the acid causes irritation and soreness (inflammation) in the esophagus. When reflux happens often or so severely that it causes damage to the esophagus, it is called gastroesophageal reflux disease (GERD). Nutrition therapy can help ease the discomfort of GERD.  FOODS OR DRINKS TO AVOID OR LIMIT  · Smoking or chewing tobacco. Nicotine is one of the most potent stimulants to acid production in the gastrointestinal tract.  · Caffeinated and decaffeinated coffee and black tea.  · Regular or low-calorie carbonated beverages or energy drinks (caffeine-free carbonated beverages are allowed).    · Strong spices, such as black pepper, white pepper, red pepper, cayenne, curry powder, and chili powder.  · Peppermint or spearmint.  · Chocolate.  · High-fat foods, including meats and fried foods. Extra added fats including oils, butter, salad dressings, and nuts. Limit these to less than 8 tsp per day.  · Fruits and vegetables if they are not tolerated, such as citrus fruits or tomatoes.  · Alcohol.  · Any food that seems to aggravate your condition.  If you have questions regarding your diet, call your caregiver or a registered dietitian.  OTHER THINGS THAT MAY HELP GERD INCLUDE:   · Eating your meals slowly, in a relaxed setting.  · Eating 5 to 6 small meals per day instead of 3 large meals.  · Eliminating food for a period of time if it causes distress.  · Not lying down until 3 hours after eating a meal.  · Keeping the head of your bed raised 6 to 9 inches (15 to 23 cm) by using a foam wedge or blocks under the legs of the bed. Lying flat may make symptoms worse.  · Being physically active. Weight loss may be helpful in reducing reflux in overweight or obese adults.  · Wear loose fitting clothing  EXAMPLE MEAL PLAN  This meal plan is approximately  2,000 calories based on ChooseMyPlate.gov meal planning guidelines.  Breakfast  · ½ cup cooked oatmeal.  · 1 cup strawberries.  · 1 cup low-fat milk.  · 1 oz almonds.  Snack  · 1 cup cucumber slices.  · 6 oz yogurt (made from low-fat or fat-free milk).  Lunch  · 2 slice whole-wheat bread.  · 2½ oz sliced turkey.  · 2 tsp mayonnaise.  · 1 cup blueberries.  · 1 cup snap peas.  Snack  · 6 whole-wheat crackers.  · 1 oz string cheese.  Dinner  · ½ cup brown rice.  · 1 cup mixed veggies.  · 1 tsp olive oil.  · 3 oz grilled fish.  Document Released: 10/09/2005 Document Revised: 01/01/2012 Document Reviewed: 08/25/2011  ExitCare® Patient Information ©2013 [employer].  Gastroesophageal Reflux Disease, Adult  Gastroesophageal reflux disease (GERD) happens when acid from your stomach flows up into the esophagus. When acid comes in contact with the esophagus, the acid causes soreness (inflammation) in the esophagus. Over time, GERD may create small holes (ulcers) in the lining of the esophagus.  CAUSES   · Increased body weight. This puts pressure on the stomach, making acid rise from the stomach into the esophagus.  · Smoking. This increases acid production in the stomach.  · Drinking alcohol. This causes decreased pressure in the lower esophageal sphincter (valve or ring of muscle between the esophagus and stomach), allowing acid from the stomach   into the esophagus.  · Late evening meals and a full stomach. This increases pressure and acid production in the stomach.  · A malformed lower esophageal sphincter.  Sometimes, no cause is found.  SYMPTOMS   · Burning pain in the lower part of the mid-chest behind the breastbone and in the mid-stomach area. This may occur twice a week or more often.  · Trouble swallowing.  · Sore throat.  · Dry cough.  · Asthma-like symptoms including chest tightness, shortness of breath, or wheezing.  DIAGNOSIS   Your caregiver may be able to diagnose GERD based on your symptoms. In some cases,  X-rays and other tests may be done to check for complications or to check the condition of your stomach and esophagus.  TREATMENT   Your caregiver may recommend over-the-counter or prescription medicines to help decrease acid production. Ask your caregiver before starting or adding any new medicines.   HOME CARE INSTRUCTIONS   · Change the factors that you can control. Ask your caregiver for guidance concerning weight loss, quitting smoking, and alcohol consumption.  · Avoid foods and drinks that make your symptoms worse, such as:  · Caffeine or alcoholic drinks.  · Chocolate.  · Peppermint or mint flavorings.  · Garlic and onions.  · Spicy foods.  · Citrus fruits, such as oranges, lemons, or limes.  · Tomato-based foods such as sauce, chili, salsa, and pizza.  · Fried and fatty foods.  · Avoid lying down for the 3 hours prior to your bedtime or prior to taking a nap.  · Eat small, frequent meals instead of large meals.  · Wear loose-fitting clothing. Do not wear anything tight around your waist that causes pressure on your stomach.  · Raise the head of your bed 6 to 8 inches with wood blocks to help you sleep. Extra pillows will not help.  · Only take over-the-counter or prescription medicines for pain, discomfort, or fever as directed by your caregiver.  · Do not take aspirin, ibuprofen, or other nonsteroidal anti-inflammatory drugs (NSAIDs).  SEEK IMMEDIATE MEDICAL CARE IF:   · You have pain in your arms, neck, jaw, teeth, or back.  · Your pain increases or changes in intensity or duration.  · You develop nausea, vomiting, or sweating (diaphoresis).  · You develop shortness of breath, or you faint.  · Your vomit is green, yellow, black, or looks like coffee grounds or blood.  · Your stool is red, bloody, or black.  These symptoms could be signs of other problems, such as heart disease, gastric bleeding, or esophageal bleeding.  MAKE SURE YOU:   · Understand these instructions.  · Will watch your  condition.  · Will get help right away if you are not doing well or get worse.  Document Released: 07/19/2005 Document Revised: 01/01/2012 Document Reviewed: 04/28/2011  ExitCare® Patient Information ©2013 [employer].

## 2013-03-05 NOTE — Progress Notes (Signed)
[facility] [address] [place] [address] [phone]  Date:  03/05/2013   Name:  Selena Peterson   DOB:  Sep 08, 1991   MRN:  [id number]  PCP:  DE LA CRUZ,IVY, DO    Chief Complaint: Sore Throat and Hypertension   History of Present Illness:  Selena Peterson is a 22 y.o. very pleasant female patient who presents with the following:  Seen at [facility] ER and had tonsillitis and elevated blood pressure.  Treated with amoxicillin.  Symptoms resolved.  She now has a recurrence of her symptoms of sore throat, myalgias, and arthralgias.  Has purulent nasal discharge and post nasal drainage..   Denies fever or chills.  Also describes a pain in her chest for the past four months.  Says comes on at night when she lays flat on her back.  No reflux or waterbrash.  No heartburn.  Says no shortness of breath, wheezing or cough.  No nausea or vomiting. No respiratory symptoms.  No improvement with over the counter medications or other home remedies. Denies other complaint or health concern today.  No history of DM, HBP, CAD, lipid elevation, smoking, premature heart disease in family  Patient Active Problem List   Diagnosis Date Noted  . Elevated blood pressure 11/12/2012  . IRREGULAR MENSES 06/30/2010  . TOBACCO USER 10/25/2009    Past Medical History  Diagnosis Date  . Chlamydia   . BV (bacterial vaginosis)   . Hypertension     Past Surgical History  Procedure Laterality Date  . Ankle fracture surgery      History  Substance Use Topics  . Smoking status: Current Every Day Smoker -- 0.30 packs/day    Types: Cigarettes  . Smokeless tobacco: Not on file  . Alcohol Use: Yes    Family History  Problem Relation Age of Onset  . Diabetes Maternal Grandmother   . Hypertension Maternal Grandmother     No Known Allergies  Medication list has been reviewed and updated.  Current Outpatient Prescriptions on File Prior to Visit  Medication Sig Dispense Refill  .  diphenhydrAMINE (BENADRYL) 25 MG tablet Take 25 mg by mouth every 6 (six) hours as needed for itching or allergies.      . Etonogestrel (IMPLANON) 68 MG IMPL Inject 1 each into the skin once. Inserted 10-18-10, to be removed 10-18-2013      . amoxicillin (AMOXIL) 500 MG capsule Take 1 capsule (500 mg total) by mouth 3 (three) times daily.  30 capsule  0  . aspirin 325 MG tablet Take 325 mg by mouth every 4 (four) hours as needed for pain.      Marland Kitchen DM-Phenylephrine-Acetaminophen (TYLENOL COLD MULTI-SYMPTOM) 10-5-325 MG/15ML LIQD Take 15-30 mLs by mouth every 6 (six) hours as needed (cough).       No current facility-administered medications on file prior to visit.    Review of Systems:  As per HPI, otherwise negative.    Physical Examination: Filed Vitals:   03/05/13 1435  BP: 138/70  Pulse: 98  Temp: 97.9 F (36.6 C)  Resp: 16   Filed Vitals:   03/05/13 1435  Height: 5' 1.5" (1.562 m)  Weight: 145 lb 3.2 oz (65.862 kg)   Body mass index is 26.99 kg/(m^2). Ideal Body Weight: Weight in (lb) to have BMI = 25: 134.2  GEN: WDWN, NAD, Non-toxic, A & O x 3 HEENT: Atraumatic, Normocephalic. Neck supple. No masses, No LAD. Ears and Nose: No external deformity.  CV: RRR, No M/G/R. No JVD. No thrill. No extra heart sounds. PULM: CTA B, no wheezes, crackles, rhonchi. No retractions. No resp. distress. No accessory muscle use. ABD: S, NT, ND, +BS. No rebound. No HSM. EXTR: No c/c/e NEURO Normal gait.  PSYCH: Normally interactive. Conversant. Not depressed or anxious appearing.  Calm demeanor.    Assessment and Plan: Sinusitis GERD Nexium No food within 2 hours of bed augmentin mucinex  Signed,  Phillips Odor, MD

## 2013-06-05 ENCOUNTER — Ambulatory Visit (INDEPENDENT_AMBULATORY_CARE_PROVIDER_SITE_OTHER): Payer: PRIVATE HEALTH INSURANCE | Admitting: Family Medicine

## 2013-06-05 ENCOUNTER — Telehealth: Payer: Self-pay

## 2013-06-05 VITALS — BP 118/78 | HR 67 | Temp 98.0°F | Resp 18 | Ht 62.0 in | Wt 151.0 lb

## 2013-06-05 DIAGNOSIS — IMO0001 Reserved for inherently not codable concepts without codable children: Secondary | ICD-10-CM

## 2013-06-05 DIAGNOSIS — Z309 Encounter for contraceptive management, unspecified: Secondary | ICD-10-CM

## 2013-06-05 DIAGNOSIS — M79609 Pain in unspecified limb: Secondary | ICD-10-CM

## 2013-06-05 DIAGNOSIS — M79641 Pain in right hand: Secondary | ICD-10-CM

## 2013-06-05 DIAGNOSIS — N946 Dysmenorrhea, unspecified: Secondary | ICD-10-CM

## 2013-06-05 LAB — POCT CBC
Granulocyte percent: 57.5 %G (ref 37–80)
HCT, POC: 47.4 % (ref 37.7–47.9)
Hemoglobin: 15.2 g/dL (ref 12.2–16.2)
Lymph, poc: 3.3 (ref 0.6–3.4)
MCH, POC: 30.3 pg (ref 27–31.2)
MCHC: 32.1 g/dL (ref 31.8–35.4)
MCV: 94.7 fL (ref 80–97)
MID (cbc): 0.9 (ref 0–0.9)
MPV: 10 fL (ref 0–99.8)
POC Granulocyte: 5.6 (ref 2–6.9)
POC LYMPH PERCENT: 33.7 %L (ref 10–50)
POC MID %: 8.8 % (ref 0–12)
Platelet Count, POC: 335 10*3/uL (ref 142–424)
RBC: 5.01 M/uL (ref 4.04–5.48)
RDW, POC: 14.3 %
WBC: 9.8 10*3/uL (ref 4.6–10.2)

## 2013-06-05 LAB — COMPREHENSIVE METABOLIC PANEL
Albumin: 4.5 g/dL (ref 3.5–5.2)
BUN: 7 mg/dL (ref 6–23)
Calcium: 9.5 mg/dL (ref 8.4–10.5)
Chloride: 104 mEq/L (ref 96–112)
Creat: 0.52 mg/dL (ref 0.50–1.10)
Glucose, Bld: 90 mg/dL (ref 70–99)
Potassium: 4.3 mEq/L (ref 3.5–5.3)

## 2013-06-05 LAB — COMPREHENSIVE METABOLIC PANEL WITH GFR
ALT: 21 U/L (ref 0–35)
AST: 16 U/L (ref 0–37)
Alkaline Phosphatase: 72 U/L (ref 39–117)
CO2: 25 meq/L (ref 19–32)
Sodium: 138 meq/L (ref 135–145)
Total Bilirubin: 0.5 mg/dL (ref 0.3–1.2)
Total Protein: 8 g/dL (ref 6.0–8.3)

## 2013-06-05 LAB — RHEUMATOID FACTOR: Rheumatoid fact SerPl-aCnc: <10 [IU]/mL (ref <=14)

## 2013-06-05 MED ORDER — TRAMADOL HCL 50 MG PO TABS: 50.0000 mg | ORAL_TABLET | Freq: Three times a day (TID) | ORAL | Status: DC | PRN

## 2013-06-05 NOTE — Patient Instructions (Addendum)

## 2013-06-05 NOTE — Progress Notes (Signed)
[facility]:  Office Visit  Chief Complaint:  Chief Complaint  Patient presents with  . Chest Pain    with movement hx of acid reflux   . Abdominal Cramping    with menses, causing so much pain she cries   . Hand Pain    pain in the morning   . implanon removed    HPI: Selena Peterson is a 22 y.o. female who complains of:  1. Hand pain and joint pain, only in the morninigs, started about 3 weeks ago, denies redness, swelling, warmth,denies any new activities, denies gout, denies any color changes or worsening pain with temp changes, denies diabetes ,  +Family history of RA and OA.  2. She is on the implanon, supposed to get it out this year, she had periods reg, no pain but now she has pain with periods and was wondering if there is less  3. GERD, was taking nexium, x 14 days with little change so she stopped.  Intermittent SOB, she is a stress smoker: Deneis DM, XOL,  She has been avoiding certain foods, avoid acid but still has some midepigastric pain   Past Medical History  Diagnosis Date  . Chlamydia   . BV (bacterial vaginosis)    Past Surgical History  Procedure Laterality Date  . Ankle fracture surgery     History   Social History  . Marital Status: Single    Spouse Name: N/A    Number of Children: N/A  . Years of Education: N/A   Social History Main Topics  . Smoking status: Current Every Day Smoker -- 0.30 packs/day    Types: Cigarettes  . Smokeless tobacco: None  . Alcohol Use: Yes  . Drug Use: 7.00 per week    Special: Marijuana     Comment: "smoke 7-8 blunts a day"  . Sexual Activity: Yes    Birth Control/ Protection: Implant   Other Topics Concern  . None   Social History Narrative  . None   Family History  Problem Relation Age of Onset  . Diabetes Maternal Grandmother   . Hypertension Maternal Grandmother    No Known Allergies Prior to Admission medications   Medication Sig Start Date End Date Taking? Authorizing  Provider  Etonogestrel (IMPLANON) 68 MG IMPL Inject 1 each into the skin once. Inserted 10-18-10, to be removed 10-18-2013   Yes Historical Provider, MD  pseudoephedrine-guaifenesin (MUCINEX D) 60-600 MG per tablet Take 1 tablet by mouth every 12 (twelve) hours. 03/05/13 03/05/14 Yes Phillips Odor, MD  amoxicillin (AMOXIL) 500 MG capsule Take 1 capsule (500 mg total) by mouth 3 (three) times daily. 02/11/13   Flint Melter, MD  amoxicillin-clavulanate (AUGMENTIN) 875-125 MG per tablet Take 1 tablet by mouth 2 (two) times daily. 03/05/13   Phillips Odor, MD  aspirin 325 MG tablet Take 325 mg by mouth every 4 (four) hours as needed for pain.    Historical Provider, MD  diphenhydrAMINE (BENADRYL) 25 MG tablet Take 25 mg by mouth every 6 (six) hours as needed for itching or allergies.    Historical Provider, MD  DM-Phenylephrine-Acetaminophen (TYLENOL COLD MULTI-SYMPTOM) 10-5-325 MG/15ML LIQD Take 15-30 mLs by mouth every 6 (six) hours as needed (cough).    Historical Provider, MD  esomeprazole (NEXIUM) 40 MG capsule Take 1 capsule (40 mg total) by mouth daily. 03/05/13   Phillips Odor, MD     ROS: The patient denies fevers, chills, night sweats, unintentional weight loss,wheezing, dyspnea on exertion,  nausea, vomiting, abdominal pain, dysuria, hematuria, melena, numbness, weakness, or tingling.   All other systems have been reviewed and were otherwise negative with the exception of those mentioned in the HPI and as above.    PHYSICAL EXAM: Filed Vitals:   06/05/13 0941  BP: 118/78  Pulse: 67  Temp: 98 F (36.7 C)  Resp: 18   Filed Vitals:   06/05/13 0941  Height: 5\' 2"  (1.575 m)  Weight: 151 lb (68.493 kg)   Body mass index is 27.61 kg/(m^2).  General: Alert, no acute distress HEENT:  Normocephalic, atraumatic, oropharynx patent. No exudates, Tm nl, No erythema.  Cardiovascular:  Regular rate and rhythm, no rubs murmurs or gallops.  No Carotid bruits, radial pulse intact. No  pedal edema.  Respiratory: Clear to auscultation bilaterally.  No wheezes, rales, or rhonchi.  No cyanosis, no use of accessory musculature GI: No organomegaly, abdomen is soft and non-tender, positive bowel sounds.  No masses. Skin: No rashes. Neurologic: Facial musculature symmetric. Psychiatric: Patient is appropriate throughout our interaction. Lymphatic: No cervical lymphadenopathy Musculoskeletal: Gait intact.   LABS: Results for orders placed in visit on 06/05/13  COMPREHENSIVE METABOLIC PANEL      Result Value Range   Sodium 138  135 - 145 mEq/L   Potassium 4.3  3.5 - 5.3 mEq/L   Chloride 104  96 - 112 mEq/L   CO2 25  19 - 32 mEq/L   Glucose, Bld 90  70 - 99 mg/dL   BUN 7  6 - 23 mg/dL   Creat 4.09  8.11 - 9.14 mg/dL   Total Bilirubin 0.5  0.3 - 1.2 mg/dL   Alkaline Phosphatase 72  39 - 117 U/L   AST 16  0 - 37 U/L   ALT 21  0 - 35 U/L   Total Protein 8.0  6.0 - 8.3 g/dL   Albumin 4.5  3.5 - 5.2 g/dL   Calcium 9.5  8.4 - 78.2 mg/dL  RHEUMATOID FACTOR      Result Value Range   Rheumatoid Factor <10  <=14 IU/mL  POCT CBC      Result Value Range   WBC 9.8  4.6 - 10.2 K/uL   Lymph, poc 3.3  0.6 - 3.4   POC LYMPH PERCENT 33.7  10 - 50 %L   MID (cbc) 0.9  0 - 0.9   POC MID % 8.8  0 - 12 %M   POC Granulocyte 5.6  2 - 6.9   Granulocyte percent 57.5  37 - 80 %G   RBC 5.01  4.04 - 5.48 M/uL   Hemoglobin 15.2  12.2 - 16.2 g/dL   HCT, POC 95.6  21.3 - 47.9 %   MCV 94.7  80 - 97 fL   MCH, POC 30.3  27 - 31.2 pg   MCHC 32.1  31.8 - 35.4 g/dL   RDW, POC 08.6     Platelet Count, POC 335  142 - 424 K/uL   MPV 10.0  0 - 99.8 fL     EKG/XRAY:   Primary read interpreted by Dr. Conley Rolls at [facility].   ASSESSMENT/PLAN: Encounter Diagnoses  Name Primary?  . Bilateral hand pain Yes  . Menstrual cramps   . Birth control    Refer to Ob/Gyn for implanon removal I think her abd cramps will be better controlled once she gets a new implanon, mds may be tapering down Advise to try otc  midol and if that does not work then I  have rx her tramadol Will get basic labs: CBC, CMP, RF Try Zantac 150 mg daily in Am and Prilosec in PM  Or viceversa for GERd sxs Gross sideeffects, risk and benefits, and alternatives of medications d/w patient. Patient is aware that all medications have potential sideeffects and we are unable to predict every sideeffect or drug-drug interaction that may occur.    Hamilton Capri PHUONG, DO 06/06/2013 12:44 PM

## 2013-06-05 NOTE — Telephone Encounter (Signed)
Patient was under the impression dr. Conley Rolls was going to call [facility] on [address] with a script for prozac, birth control and nexium.

## 2013-07-07 ENCOUNTER — Encounter: Payer: Medicaid Other | Admitting: Medical

## 2013-09-03 ENCOUNTER — Encounter: Payer: Self-pay | Admitting: Family Medicine

## 2013-09-03 ENCOUNTER — Ambulatory Visit (INDEPENDENT_AMBULATORY_CARE_PROVIDER_SITE_OTHER): Payer: Medicaid Other | Admitting: Family Medicine

## 2013-09-03 VITALS — BP 133/85 | HR 76 | Temp 98.1°F | Ht 60.0 in | Wt 148.0 lb

## 2013-09-03 DIAGNOSIS — J309 Allergic rhinitis, unspecified: Secondary | ICD-10-CM

## 2013-09-03 DIAGNOSIS — R0981 Nasal congestion: Secondary | ICD-10-CM | POA: Insufficient documentation

## 2013-09-03 DIAGNOSIS — J3489 Other specified disorders of nose and nasal sinuses: Secondary | ICD-10-CM

## 2013-09-03 MED ORDER — FLUTICASONE PROPIONATE 50 MCG/ACT NA SUSP: 2.0000 | Freq: Every day | NASAL | Status: DC

## 2013-09-03 NOTE — Assessment & Plan Note (Signed)
Assessment: patient does not have a true shortness of breath or wheezing but rather just nasal congestion which I believe is due to allergic rhinitis Plan: start intranasal steroids, fluticasone, 2 sprays in each nostril daily; follow up in 4 weeks

## 2013-09-03 NOTE — Patient Instructions (Addendum)
We will treat you for allergic rhinitis with an inhaled steroid nasal spray, Flonase.  Take this for a couple of weeks and see if there is an improvement in your breathing.  You can follow-up with this at your upcoming appointment.

## 2013-09-03 NOTE — Progress Notes (Signed)
  Subjective:    Patient ID: Selena Peterson, female    DOB: May 04, 1991, 22 y.o.   MRN: [id number]  HPI  22 year old F with difficulty breathing through her nose and "wheezing" for 6 months duration. This occurs mainly at night while lying down. She denies a cough. She notes that she has lots of nasal and sinus congestion that bothers her. She does not take any medication for congestion. She has no history of asthma and is able to exercise without limitation.   Review of Systems No fe    Objective:   Physical Exam BP 133/85  Pulse 76  Temp(Src) 98.1 F (36.7 C) (Oral)  Ht 5' (1.524 m)  Wt 148 lb (67.132 kg)  BMI 28.90 kg/m2  LMP 08/17/2013 Gen: well appearing, young female, pleasant HEENT: NCAT, OP clear and moist, very large tonsil without exudate; no submandibular lymphadenopathy, normal nasal turbinates, no sinus tenderness Pulm: normal work of breathing, no wheezes, rhonchi, rales or stridor       Assessment & Plan:

## 2013-09-17 ENCOUNTER — Ambulatory Visit: Payer: Medicaid Other | Admitting: Family Medicine

## 2013-09-30 ENCOUNTER — Ambulatory Visit (INDEPENDENT_AMBULATORY_CARE_PROVIDER_SITE_OTHER): Payer: Medicaid Other | Admitting: Family Medicine

## 2013-09-30 ENCOUNTER — Encounter: Payer: Self-pay | Admitting: Family Medicine

## 2013-09-30 VITALS — BP 153/93 | HR 64 | Temp 98.1°F | Ht 60.0 in | Wt 147.0 lb

## 2013-09-30 DIAGNOSIS — N926 Irregular menstruation, unspecified: Secondary | ICD-10-CM

## 2013-09-30 DIAGNOSIS — J3489 Other specified disorders of nose and nasal sinuses: Secondary | ICD-10-CM

## 2013-09-30 DIAGNOSIS — R0981 Nasal congestion: Secondary | ICD-10-CM

## 2013-09-30 MED ORDER — CETIRIZINE HCL 10 MG PO TABS: 10.0000 mg | ORAL_TABLET | Freq: Every day | ORAL | Status: DC

## 2013-09-30 MED ORDER — NAPROXEN 500 MG PO TABS: 500.0000 mg | ORAL_TABLET | Freq: Two times a day (BID) | ORAL | Status: DC

## 2013-09-30 NOTE — Assessment & Plan Note (Signed)
Start naproxyn Advised against percocet which she has used a couple of times from her mother.

## 2013-09-30 NOTE — Progress Notes (Signed)
Patient ID: Selena Peterson, female   DOB: 01-17-1991, 22 y.o.   MRN: [id number]  Kevin Fenton, MD Phone: [phone]  Subjective:  Chief complaint-noted  Pt here for implanon removal. States that she has liked her implanon and has not any problems since she started except for irregular periods in the last 1 year. She describes moderate to heavy flow for 1 week that stops and starts again 2 days later for an additional 1 week. She states this has been happening for 12 months.  She gets severe cramps with her periods for she's been taking ibuprofen for her. She states that ibuprofen helps decrease the flow and helps her cramps some but does not completely alleviate her symptoms.  She has nasal congestion and has tried flonase but really dislaikes the taste, so shed like to try another allergy medication.   ROS-per history of present illness  Past Medical History Patient Active Problem List   Diagnosis Date Noted  . Nasal congestion 09/03/2013  . Elevated blood pressure 11/12/2012  . IRREGULAR MENSES 06/30/2010  . TOBACCO USER 10/25/2009    Medications- reviewed and updated Current Outpatient Prescriptions  Medication Sig Dispense Refill  . aspirin 325 MG tablet Take 325 mg by mouth every 4 (four) hours as needed for pain.      . cetirizine (ZYRTEC) 10 MG tablet Take 1 tablet (10 mg total) by mouth daily.  30 tablet  11  . diphenhydrAMINE (BENADRYL) 25 MG tablet Take 25 mg by mouth every 6 (six) hours as needed for itching or allergies.      Marland Kitchen DM-Phenylephrine-Acetaminophen (TYLENOL COLD MULTI-SYMPTOM) 10-5-325 MG/15ML LIQD Take 15-30 mLs by mouth every 6 (six) hours as needed (cough).      . fluticasone (FLONASE) 50 MCG/ACT nasal spray Place 2 sprays into both nostrils daily.  16 g  6  . naproxen (NAPROSYN) 500 MG tablet Take 1 tablet (500 mg total) by mouth 2 (two) times daily with a meal.  30 tablet  0  . pseudoephedrine-guaifenesin (MUCINEX D) 60-600 MG per tablet Take 1 tablet by  mouth every 12 (twelve) hours.  18 tablet  0  . traMADol (ULTRAM) 50 MG tablet Take 1 tablet (50 mg total) by mouth every 8 (eight) hours as needed for pain.  30 tablet  0   No current facility-administered medications for this visit.    Objective: BP 153/93  Pulse 64  Temp(Src) 98.1 F (36.7 C) (Oral)  Ht 5' (1.524 m)  Wt 147 lb (66.679 kg)  BMI 28.71 kg/m2  LMP 09/08/2013 Gen: NAD, alert, cooperative with exam HEENT: NCAT, EOMI, PERRL CV: RRR, good S1/S2, no murmur Resp: CTABL, no wheezes, non-labored Abd: SNTND, BS present, no guarding or organomegaly Ext: No edema, warm Neuro: Alert and oriented, No gross deficits   Procedure- implanon removal Patient given informed consent for removal of her Implanon, time out was performed.  Signed copy in the chart.  Appropriate time out taken. Implanon site identified.  Area prepped in usual sterile fashon. 5 cc of 1% lidocaine was used to anesthetize the area at the distal end of the implant. A small stab incision was made right beside the implant on the distal portion.  The initial stab incision was fel to be too far from teh tip so an additional incision was made.    The implanon rod was grasped using hemostats and removed without difficulty.  There was less than 3 cc blood loss. There were no complications.  A small amount  of antibiotic ointment and steri-strips were applied over the small incision.  A pressure bandage was applied to reduce any bruising.  The patient tolerated the procedure well and was given post procedure instructions.  Assessment/Plan:  Nasal congestion Dc flonase, will try zyrtec Declined neti pot  IRREGULAR MENSES Start naproxyn Advised against percocet which she has used a couple of times from her mother.     No orders of the defined types were placed in this encounter.    Meds ordered this encounter  Medications  . cetirizine (ZYRTEC) 10 MG tablet    Sig: Take 1 tablet (10 mg total) by mouth daily.     Dispense:  30 tablet    Refill:  11  . naproxen (NAPROSYN) 500 MG tablet    Sig: Take 1 tablet (500 mg total) by mouth 2 (two) times daily with a meal.    Dispense:  30 tablet    Refill:  0    ]

## 2013-09-30 NOTE — Patient Instructions (Signed)
It was great to meet you today!  Skip your shower tonight but you can take int in the morning. You wont need to take the steri strips off, they will fall off in a few days.   If your periods don't lighten up be sure to see me soon.

## 2013-09-30 NOTE — Assessment & Plan Note (Signed)
Dc flonase, will try zyrtec Declined neti pot

## 2013-11-10 ENCOUNTER — Encounter: Payer: Medicaid Other | Admitting: Family Medicine

## 2014-01-09 ENCOUNTER — Emergency Department (INDEPENDENT_AMBULATORY_CARE_PROVIDER_SITE_OTHER)
Admission: EM | Admit: 2014-01-09 | Discharge: 2014-01-09 | Disposition: A | Discharge status: Home / Self Care | Payer: Self-pay | Source: Home / Self Care | Attending: Family Medicine | Admitting: Family Medicine

## 2014-01-09 ENCOUNTER — Encounter (HOSPITAL_COMMUNITY): Payer: Self-pay | Admitting: Emergency Medicine

## 2014-01-09 ENCOUNTER — Other Ambulatory Visit (HOSPITAL_COMMUNITY)
Admission: RE | Admit: 2014-01-09 | Discharge: 2014-01-09 | Disposition: A | Discharge status: Home / Self Care | Payer: Medicaid Other | Source: Ambulatory Visit | Attending: Family Medicine | Admitting: Family Medicine

## 2014-01-09 DIAGNOSIS — J329 Chronic sinusitis, unspecified: Secondary | ICD-10-CM

## 2014-01-09 DIAGNOSIS — Z202 Contact with and (suspected) exposure to infections with a predominantly sexual mode of transmission: Secondary | ICD-10-CM

## 2014-01-09 DIAGNOSIS — N760 Acute vaginitis: Secondary | ICD-10-CM | POA: Insufficient documentation

## 2014-01-09 DIAGNOSIS — Z113 Encounter for screening for infections with a predominantly sexual mode of transmission: Secondary | ICD-10-CM | POA: Insufficient documentation

## 2014-01-09 LAB — POCT PREGNANCY, URINE: Preg Test, Ur: NEGATIVE

## 2014-01-09 MED ORDER — CEFTRIAXONE SODIUM 250 MG IJ SOLR
INTRAMUSCULAR | Status: AC
  Filled 2014-01-09: qty 250

## 2014-01-09 MED ORDER — CEFTRIAXONE SODIUM 250 MG IJ SOLR
250.0000 mg | Freq: Once | INTRAMUSCULAR | Status: AC
  Administered 2014-01-09: 250 mg via INTRAMUSCULAR

## 2014-01-09 MED ORDER — AZITHROMYCIN 250 MG PO TABS
1000.0000 mg | ORAL_TABLET | Freq: Once | ORAL | Status: AC
  Administered 2014-01-09: 1000 mg via ORAL

## 2014-01-09 MED ORDER — IPRATROPIUM BROMIDE 0.06 % NA SOLN: 2.0000 | Freq: Four times a day (QID) | NASAL | Status: DC

## 2014-01-09 MED ORDER — PREDNISONE 10 MG PO TABS: 30.0000 mg | ORAL_TABLET | Freq: Every day | ORAL | Status: DC

## 2014-01-09 MED ORDER — AZITHROMYCIN 250 MG PO TABS
ORAL_TABLET | ORAL | Status: AC
  Filled 2014-01-09: qty 4

## 2014-01-09 MED ORDER — LIDOCAINE HCL (PF) 1 % IJ SOLN
INTRAMUSCULAR | Status: AC
  Filled 2014-01-09: qty 5

## 2014-01-09 NOTE — ED Provider Notes (Signed)
TOI Selena Peterson is a 23 y.o. female who presents to Urgent Care today for cough and congestion and STD exposure. 1) patient has had 3 days of cough congestion nasal discharge runny nose sneezing and headache. She has tried TheraFlu Mucinex and Tylenol PM which helped a bit. She denies any nausea vomiting or diarrhea. She denies any chest pains or palpitations or significant shortness of breath. She feels well otherwise.  2) STD exposure: Patient's boyfriend just told her that he tested positive for gonorrhea. She is completely asymptomatic but would like treatment. She feels well otherwise. No dysuria urinary frequency or urgency. Currently menstruating.   Past Medical History  Diagnosis Date  . Chlamydia   . BV (bacterial vaginosis)    History  Substance Use Topics  . Smoking status: Current Every Day Smoker -- 0.30 packs/day    Types: Cigarettes  . Smokeless tobacco: Not on file  . Alcohol Use: Yes   ROS as above Medications: No current facility-administered medications for this encounter.   Current Outpatient Prescriptions  Medication Sig Dispense Refill  . pseudoephedrine-acetaminophen (TYLENOL SINUS) 30-500 MG TABS Take 1 tablet by mouth every 4 (four) hours as needed.      Marland Kitchen aspirin 325 MG tablet Take 325 mg by mouth every 4 (four) hours as needed for pain.      . cetirizine (ZYRTEC) 10 MG tablet Take 1 tablet (10 mg total) by mouth daily.  30 tablet  11  . diphenhydrAMINE (BENADRYL) 25 MG tablet Take 25 mg by mouth every 6 (six) hours as needed for itching or allergies.      Marland Kitchen DM-Phenylephrine-Acetaminophen (TYLENOL COLD MULTI-SYMPTOM) 10-5-325 MG/15ML LIQD Take 15-30 mLs by mouth every 6 (six) hours as needed (cough).      Marland Kitchen ipratropium (ATROVENT) 0.06 % nasal spray Place 2 sprays into both nostrils 4 (four) times daily.  15 mL  1  . naproxen (NAPROSYN) 500 MG tablet Take 1 tablet (500 mg total) by mouth 2 (two) times daily with a meal.  30 tablet  0  . predniSONE (DELTASONE) 10  MG tablet Take 3 tablets (30 mg total) by mouth daily.  15 tablet  0  . pseudoephedrine-guaifenesin (MUCINEX D) 60-600 MG per tablet Take 1 tablet by mouth every 12 (twelve) hours.  18 tablet  0  . traMADol (ULTRAM) 50 MG tablet Take 1 tablet (50 mg total) by mouth every 8 (eight) hours as needed for pain.  30 tablet  0    Exam:  BP 138/96  Pulse 90  Temp(Src) 98.7 F (37.1 C) (Oral)  Resp 16  SpO2 99% Gen: Well NAD HEENT: EOMI,  MMM posterior pharynx with cobblestoning. Normal tympanic membranes bilaterally. Clear nasal discharge with mildly inflamed nasal turbinates. Lungs: Normal work of breathing. CTABL Heart: RRR no MRG Abd: NABS, Soft. NT, ND no rebound or guarding. Exts: Brisk capillary refill, warm and well perfused.   Results for orders placed during the hospital encounter of 01/09/14 (from the past 24 hour(s))  POCT PREGNANCY, URINE     Status: None   Collection Time    01/09/14  5:36 PM      Result Value Ref Range   Preg Test, Ur NEGATIVE  NEGATIVE   No results found.  Assessment and Plan: 23 y.o. female with sinusitis. Likely viral. Plan to treat with prednisone and Atrovent nasal spray. Additionally patient is receiving a gram of azithromycin and 250 mg IM ceftriaxone. This would treat most bacterial sinusitis is as well. Followup  as needed 2) STD exposure: As above. Cytology pending.  Discussed warning signs or symptoms. Please see discharge instructions. Patient expresses understanding.    Gregor Hams, MD 01/09/14 [phone]

## 2014-01-09 NOTE — Discharge Instructions (Signed)
Thank you for coming in today. Prednisone daily for 5 days Use Atrovent nasal spray as needed Please quit smoking Call or go to the emergency room if you get worse, have trouble breathing, have chest pains, or palpitations.  I will call you with those results if needed Sinusitis Sinusitis is redness, soreness, and swelling (inflammation) of the paranasal sinuses. Paranasal sinuses are air pockets within the bones of your face (beneath the eyes, the middle of the forehead, or above the eyes). In healthy paranasal sinuses, mucus is able to drain out, and air is able to circulate through them by way of your nose. However, when your paranasal sinuses are inflamed, mucus and air can become trapped. This can allow bacteria and other germs to grow and cause infection. Sinusitis can develop quickly and last only a short time (acute) or continue over a long period (chronic). Sinusitis that lasts for more than 12 weeks is considered chronic.  CAUSES  Causes of sinusitis include:  Allergies.  Structural abnormalities, such as displacement of the cartilage that separates your nostrils (deviated septum), which can decrease the air flow through your nose and sinuses and affect sinus drainage.  Functional abnormalities, such as when the small hairs (cilia) that line your sinuses and help remove mucus do not work properly or are not present. SYMPTOMS  Symptoms of acute and chronic sinusitis are the same. The primary symptoms are pain and pressure around the affected sinuses. Other symptoms include:  Upper toothache.  Earache.  Headache.  Bad breath.  Decreased sense of smell and taste.  A cough, which worsens when you are lying flat.  Fatigue.  Fever.  Thick drainage from your nose, which often is green and may contain pus (purulent).  Swelling and warmth over the affected sinuses. DIAGNOSIS  Your caregiver will perform a physical exam. During the exam, your caregiver may:  Look in your  nose for signs of abnormal growths in your nostrils (nasal polyps).  Tap over the affected sinus to check for signs of infection.  View the inside of your sinuses (endoscopy) with a special imaging device with a light attached (endoscope), which is inserted into your sinuses. If your caregiver suspects that you have chronic sinusitis, one or more of the following tests may be recommended:  Allergy tests.  Nasal culture A sample of mucus is taken from your nose and sent to a lab and screened for bacteria.  Nasal cytology A sample of mucus is taken from your nose and examined by your caregiver to determine if your sinusitis is related to an allergy. TREATMENT  Most cases of acute sinusitis are related to a viral infection and will resolve on their own within 10 days. Sometimes medicines are prescribed to help relieve symptoms (pain medicine, decongestants, nasal steroid sprays, or saline sprays).  However, for sinusitis related to a bacterial infection, your caregiver will prescribe antibiotic medicines. These are medicines that will help kill the bacteria causing the infection.  Rarely, sinusitis is caused by a fungal infection. In theses cases, your caregiver will prescribe antifungal medicine. For some cases of chronic sinusitis, surgery is needed. Generally, these are cases in which sinusitis recurs more than 3 times per year, despite other treatments. HOME CARE INSTRUCTIONS   Drink plenty of water. Water helps thin the mucus so your sinuses can drain more easily.  Use a humidifier.  Inhale steam 3 to 4 times a day (for example, sit in the bathroom with the shower running).  Apply a warm,  moist washcloth to your face 3 to 4 times a day, or as directed by your caregiver.  Use saline nasal sprays to help moisten and clean your sinuses.  Take over-the-counter or prescription medicines for pain, discomfort, or fever only as directed by your caregiver. SEEK IMMEDIATE MEDICAL CARE  IF:  You have increasing pain or severe headaches.  You have nausea, vomiting, or drowsiness.  You have swelling around your face.  You have vision problems.  You have a stiff neck.  You have difficulty breathing. MAKE SURE YOU:   Understand these instructions.  Will watch your condition.  Will get help right away if you are not doing well or get worse. Document Released: 10/09/2005 Document Revised: 01/01/2012 Document Reviewed: 10/24/2011 [facility] Patient Information 2014 [place], [place].

## 2014-01-10 ENCOUNTER — Telehealth: Payer: Self-pay | Admitting: Family Medicine

## 2014-01-10 NOTE — Telephone Encounter (Signed)
Entered in error, please disregard.

## 2014-01-12 LAB — URINE CYTOLOGY ANCILLARY ONLY
Chlamydia: POSITIVE — AB
Neisseria Gonorrhea: NEGATIVE
TRICH (WINDOWPATH): POSITIVE — AB

## 2014-01-13 NOTE — ED Notes (Addendum)
GC neg., Chlamydia and Trich pos.  Pt. adequately treated with Zithromax and Rocephin.  DHHS form completed and faxed to the [facility].  Message sent to Dr. Georgina Snell. Selena Peterson 01/13/2014 Dr. Georgina Snell e-prescribed Flagyl to pt.'s pharmacy. 01/15/2014

## 2014-01-15 ENCOUNTER — Telehealth (HOSPITAL_COMMUNITY): Payer: Self-pay | Admitting: Family Medicine

## 2014-01-15 MED ORDER — METRONIDAZOLE 500 MG PO TABS
500.0000 mg | ORAL_TABLET | Freq: Two times a day (BID) | ORAL | Status: DC
Start: 2014-01-15 — End: 2014-03-18

## 2014-01-15 NOTE — ED Notes (Signed)
Pt with trich.  [facility]  RN to contact patient.   Gregor Hams, MD 01/15/14 [phone]

## 2014-01-15 NOTE — ED Notes (Addendum)
I called pt. and left a message to call.  Call 1. Selena Peterson 01/15/2014 Pt. called back.  Pt. verified x 2 and given results.  Pt. told she was adequately treated for Chlamydia and needs Flagyl for Trich.  Pt. told where to pick up her Rx. and  instructed to no alcohol while taking this medication.  Pt. instructed to notify her partner, no sex until partner has finished Flagyl and to practice safe sex. Pt. told she can get HIV testing at the [facility]. STD clinic, by appointment.  Pt.'s phone dropped the call. I was not sure if she heard all of the instructions. I called back and left a message to call. 01/15/2014

## 2014-02-04 ENCOUNTER — Encounter (HOSPITAL_COMMUNITY): Payer: Self-pay | Admitting: Emergency Medicine

## 2014-02-04 ENCOUNTER — Other Ambulatory Visit (HOSPITAL_COMMUNITY)
Admission: RE | Admit: 2014-02-04 | Discharge: 2014-02-04 | Disposition: A | Discharge status: Home / Self Care | Payer: Medicaid Other | Source: Ambulatory Visit | Attending: Emergency Medicine | Admitting: Emergency Medicine

## 2014-02-04 ENCOUNTER — Emergency Department (INDEPENDENT_AMBULATORY_CARE_PROVIDER_SITE_OTHER)
Admission: EM | Admit: 2014-02-04 | Discharge: 2014-02-04 | Disposition: A | Discharge status: Home / Self Care | Payer: Medicaid Other | Source: Home / Self Care | Attending: Emergency Medicine | Admitting: Emergency Medicine

## 2014-02-04 DIAGNOSIS — Z113 Encounter for screening for infections with a predominantly sexual mode of transmission: Secondary | ICD-10-CM | POA: Diagnosis not present

## 2014-02-04 DIAGNOSIS — N939 Abnormal uterine and vaginal bleeding, unspecified: Secondary | ICD-10-CM

## 2014-02-04 DIAGNOSIS — N898 Other specified noninflammatory disorders of vagina: Secondary | ICD-10-CM

## 2014-02-04 DIAGNOSIS — N760 Acute vaginitis: Secondary | ICD-10-CM | POA: Diagnosis present

## 2014-02-04 LAB — POCT PREGNANCY, URINE: PREG TEST UR: NEGATIVE

## 2014-02-04 NOTE — ED Notes (Signed)
Please call patient at : [phone] for any lab issues

## 2014-02-04 NOTE — Discharge Instructions (Signed)
If your labs indicate the need for treatment, you will be notified by phone and medication can be called in to the pharmacy of your choice.   Dysfunctional Uterine Bleeding Normally, menstrual periods begin between ages 62 to 56 in young women. A normal menstrual cycle/period may begin every 23 days up to 35 days and lasts from 1 to 7 days. Around 12 to 14 days before your menstrual period starts, ovulation (ovary produces an egg) occurs. When counting the time between menstrual periods, count from the first day of bleeding of the previous period to the first day of bleeding of the next period. Dysfunctional (abnormal) uterine bleeding is bleeding that is different from a normal menstrual period. Your periods may come earlier or later than usual. They may be lighter, have blood clots or be heavier. You may have bleeding between periods, or you may skip one period or more. You may have bleeding after sexual intercourse, bleeding after menopause, or no menstrual period. CAUSES   Pregnancy (normal, miscarriage, tubal).  IUDs (intrauterine device, birth control).  Birth control pills.  Hormone treatment.  Menopause.  Infection of the cervix.  Blood clotting problems.  Infection of the inside lining of the uterus.  Endometriosis, inside lining of the uterus growing in the pelvis and other female organs.  Adhesions (scar tissue) inside the uterus.  Obesity or severe weight loss.  Uterine polyps inside the uterus.  Cancer of the vagina, cervix, or uterus.  Ovarian cysts or polycystic ovary syndrome.  Medical problems (diabetes, thyroid disease).  Uterine fibroids (noncancerous tumor).  Problems with your female hormones.  Endometrial hyperplasia, very thick lining and enlarged cells inside of the uterus.  Medicines that interfere with ovulation.  Radiation to the pelvis or abdomen.  Chemotherapy. DIAGNOSIS   Your doctor will discuss the history of your menstrual periods,  medicines you are taking, changes in your weight, stress in your life, and any medical problems you may have.  Your doctor will do a physical and pelvic examination.  Your doctor may want to perform certain tests to make a diagnosis, such as:  Pap test.  Blood tests.  Cultures for infection.  CT scan.  Ultrasound.  Hysteroscopy.  Laparoscopy.  MRI.  Hysterosalpingography.  D and C.  Endometrial biopsy. TREATMENT  Treatment will depend on the cause of the dysfunctional uterine bleeding (DUB). Treatment may include:  Observing your menstrual periods for a couple of months.  Prescribing medicines for medical problems, including:  Antibiotics.  Hormones.  Birth control pills.  Removing an IUD (intrauterine device, birth control).  Surgery:  D and C (scrape and remove tissue from inside the uterus).  Laparoscopy (examine inside the abdomen with a lighted tube).  Uterine ablation (destroy lining of the uterus with electrical current, laser, heat, or freezing).  Hysteroscopy (examine cervix and uterus with a lighted tube).  Hysterectomy (remove the uterus). HOME CARE INSTRUCTIONS   If medicines were prescribed, take exactly as directed. Do not change or switch medicines without consulting your caregiver.  Long term heavy bleeding may result in iron deficiency. Your caregiver may have prescribed iron pills. They help replace the iron that your body lost from heavy bleeding. Take exactly as directed.  Do not take aspirin or medicines that contain aspirin one week before or during your menstrual period. Aspirin may make the bleeding worse.  If you need to change your sanitary pad or tampon more than once every 2 hours, stay in bed with your feet elevated and a  cold pack on your lower abdomen. Rest as much as possible, until the bleeding stops or slows down.  Eat well-balanced meals. Eat foods high in iron. Examples are:  Leafy green vegetables.  Whole-grain  breads and cereals.  Eggs.  Meat.  Liver.  Do not try to lose weight until the abnormal bleeding has stopped and your blood iron level is back to normal. Do not lift more than ten pounds or do strenuous activities when you are bleeding.  For a couple of months, make note on your calendar, marking the start and ending of your period, and the type of bleeding (light, medium, heavy, spotting, clots or missed periods). This is for your caregiver to better evaluate your problem. SEEK MEDICAL CARE IF:   You develop nausea (feeling sick to your stomach) and vomiting, dizziness, or diarrhea while you are taking your medicine.  You are getting lightheaded or weak.  You have any problems that may be related to the medicine you are taking.  You develop pain with your DUB.  You want to remove your IUD.  You want to stop or change your birth control pills or hormones.  You have any type of abnormal bleeding mentioned above.  You are over 34 years old and have not had a menstrual period yet.  You are 23 years old and you are still having menstrual periods.  You have any of the symptoms mentioned above.  You develop a rash. SEEK IMMEDIATE MEDICAL CARE IF:   An oral temperature above 102 F (38.9 C) develops.  You develop chills.  You are changing your sanitary pad or tampon more than once an hour.  You develop abdominal pain.  You pass out or faint. Document Released: 10/06/2000 Document Revised: 01/01/2012 Document Reviewed: 09/07/2009 [facility] Patient Information 2014 [place].

## 2014-02-04 NOTE — ED Notes (Signed)
Onset menses 4-11, and since then, here usual period has has let up, but gotten very heavy w reported "handfuls of clots" . Also c/o pain in her left wrist x 3 weeks

## 2014-02-04 NOTE — ED Provider Notes (Signed)
CSN: [id number]     Arrival date & time 02/04/14  1711 History   First MD Initiated Contact with Patient 02/04/14 1914     Chief Complaint  Patient presents with  . Menometrorrhagia   (Consider location/radiation/quality/duration/timing/severity/associated sxs/prior Treatment) HPI Comments: Report her usual menstrual cycle began 4 days ago and bleeding has been heavier that usual and has had associated abdominal and pelvic cramping. Denies vaginal discharge prior to menses. No urinary symptoms. No fever. Discontinued taking OCPs in December 2015.  PCP: [facility]  The history is provided by the patient.    Past Medical History  Diagnosis Date  . Chlamydia   . BV (bacterial vaginosis)    Past Surgical History  Procedure Laterality Date  . Ankle fracture surgery     Family History  Problem Relation Age of Onset  . Diabetes Maternal Grandmother   . Hypertension Maternal Grandmother    History  Substance Use Topics  . Smoking status: Current Every Day Smoker -- 0.30 packs/day    Types: Cigarettes  . Smokeless tobacco: Not on file  . Alcohol Use: Yes   OB History   Grav Para Term Preterm Abortions TAB SAB Ect Mult Living                 Review of Systems  All other systems reviewed and are negative.   Allergies  Review of patient's allergies indicates no known allergies.  Home Medications   Prior to Admission medications   Medication Sig Start Date End Date Taking? Authorizing Provider  aspirin 325 MG tablet Take 325 mg by mouth every 4 (four) hours as needed for pain.    Historical Provider, MD  cetirizine (ZYRTEC) 10 MG tablet Take 1 tablet (10 mg total) by mouth daily. 09/30/13   Timmothy Euler, MD  diphenhydrAMINE (BENADRYL) 25 MG tablet Take 25 mg by mouth every 6 (six) hours as needed for itching or allergies.    Historical Provider, MD  DM-Phenylephrine-Acetaminophen (TYLENOL COLD MULTI-SYMPTOM) 10-5-325 MG/15ML LIQD Take 15-30 mLs by mouth every 6 (six) hours as  needed (cough).    Historical Provider, MD  ipratropium (ATROVENT) 0.06 % nasal spray Place 2 sprays into both nostrils 4 (four) times daily. 01/09/14   Gregor Hams, MD  metroNIDAZOLE (FLAGYL) 500 MG tablet Take 1 tablet (500 mg total) by mouth 2 (two) times daily. 01/15/14   Gregor Hams, MD  naproxen (NAPROSYN) 500 MG tablet Take 1 tablet (500 mg total) by mouth 2 (two) times daily with a meal. 09/30/13   Timmothy Euler, MD  predniSONE (DELTASONE) 10 MG tablet Take 3 tablets (30 mg total) by mouth daily. 01/09/14   Gregor Hams, MD  pseudoephedrine-acetaminophen (TYLENOL SINUS) 30-500 MG TABS Take 1 tablet by mouth every 4 (four) hours as needed.    Historical Provider, MD  pseudoephedrine-guaifenesin (MUCINEX D) 60-600 MG per tablet Take 1 tablet by mouth every 12 (twelve) hours. 03/05/13 03/05/14  Ellison Carwin, MD  traMADol (ULTRAM) 50 MG tablet Take 1 tablet (50 mg total) by mouth every 8 (eight) hours as needed for pain. 06/05/13   Thao P Le, DO   BP 136/90  Pulse 73  Temp(Src) 98.2 F (36.8 C) (Oral)  Resp 16  SpO2 100%  LMP 01/31/2014 Physical Exam  Nursing note and vitals reviewed. Constitutional: She is oriented to person, place, and time. She appears well-developed and well-nourished. No distress.  HENT:  Head: Normocephalic and atraumatic.  Eyes: Conjunctivae are normal. No scleral  icterus.  Cardiovascular: Normal rate.   Pulmonary/Chest: Effort normal.  Abdominal: Soft. Bowel sounds are normal. She exhibits no distension. There is no tenderness.  Genitourinary: Uterus normal. Pelvic exam was performed with patient supine. There is no rash, tenderness or lesion on the right labia. There is no rash, tenderness or lesion on the left labia. Cervix exhibits no motion tenderness, no discharge and no friability. Right adnexum displays no mass, no tenderness and no fullness. Left adnexum displays no mass, no tenderness and no fullness. There is bleeding around the vagina. No erythema  or tenderness around the vagina. No foreign body around the vagina. No signs of injury around the vagina. No vaginal discharge found.  Vaginal bleeding consistent with normal menstrual flow  Musculoskeletal: Normal range of motion.  Neurological: She is alert and oriented to person, place, and time.  Skin: Skin is warm and dry.  Psychiatric: She has a normal mood and affect. Her behavior is normal.    ED Course  Procedures (including critical care time) Labs Review Labs Reviewed  POCT PREGNANCY, URINE  CERVICOVAGINAL ANCILLARY ONLY    Results for orders placed during the hospital encounter of 02/04/14  POCT PREGNANCY, URINE      Result Value Ref Range   Preg Test, Ur NEGATIVE  NEGATIVE   Imaging Review No results found.   MDM   1. Vaginal bleeding   Swab sent for STI testing. UPT negative. Advised PCP follow up or OB/Gyn follow up if menstrual cycle does not finish over next 2-3 days.     [place], [place] 02/04/14 2056

## 2014-02-05 LAB — CERVICOVAGINAL ANCILLARY ONLY
Chlamydia: NEGATIVE
Neisseria Gonorrhea: NEGATIVE
WET PREP (BD AFFIRM): POSITIVE — AB
Wet Prep (BD Affirm): NEGATIVE
Wet Prep (BD Affirm): POSITIVE — AB

## 2014-02-05 NOTE — ED Provider Notes (Signed)
Medical screening examination/treatment/procedure(s) were performed by non-physician practitioner and as supervising physician I was immediately available for consultation/collaboration.  Philipp Deputy, M.D.  Harden Mo, MD 02/05/14 [phone]

## 2014-02-06 ENCOUNTER — Telehealth (HOSPITAL_COMMUNITY): Payer: Self-pay | Admitting: *Deleted

## 2014-02-06 MED ORDER — METRONIDAZOLE 500 MG PO TABS: 500.0000 mg | ORAL_TABLET | Freq: Two times a day (BID) | ORAL | Status: DC

## 2014-02-06 NOTE — ED Notes (Signed)
GC/Chlamydia neg., Affirm: Candida and Gardnerella pos. and Trich neg.  4/16 Message sent to Dot Been PA.  4/17 She e-prescribed Flagyl to pt.'s pharmacy and said for her to use Monistat for the yeast infection. I called pt.'s  number and it does not accept incoming calls.  I called contact- Mom, and left a message for pt. to call.  Call 1. Hanley Seamen [facility] 02/06/2014

## 2014-02-09 NOTE — ED Notes (Addendum)
I called and Mom said she gave her the message. I told her that I was not here this weekend and she may have called back on my VM.  I told her to have her call me at [phone] and ask for me. It old her I would be her until 2130. Hanley Seamen Reisa Coppola. 02/09/2014 Pt. called back.  Pt. verified x 2 and given results.  Pt. told she need Flagyl for bacterial vaginosis and Monistat for yeast.  Pt. told where to pick up her Rx. of Flagyl.  Pt. instructed to no alcohol while taking this medication. Pt. voiced understanding. 02/09/2014

## 2014-03-13 ENCOUNTER — Encounter: Payer: Self-pay | Admitting: Emergency Medicine

## 2014-03-13 ENCOUNTER — Ambulatory Visit (INDEPENDENT_AMBULATORY_CARE_PROVIDER_SITE_OTHER): Payer: Medicaid Other | Admitting: Emergency Medicine

## 2014-03-13 VITALS — BP 133/84 | HR 92 | Temp 98.4°F | Wt 142.0 lb

## 2014-03-13 DIAGNOSIS — K089 Disorder of teeth and supporting structures, unspecified: Secondary | ICD-10-CM

## 2014-03-13 DIAGNOSIS — K0889 Other specified disorders of teeth and supporting structures: Secondary | ICD-10-CM | POA: Insufficient documentation

## 2014-03-13 MED ORDER — PENICILLIN V POTASSIUM 500 MG PO TABS: 500.0000 mg | ORAL_TABLET | Freq: Four times a day (QID) | ORAL | Status: DC

## 2014-03-13 MED ORDER — HYDROCODONE-ACETAMINOPHEN 5-325 MG PO TABS: 1.0000 | ORAL_TABLET | Freq: Four times a day (QID) | ORAL | Status: DC | PRN

## 2014-03-13 NOTE — Progress Notes (Signed)
   Subjective:    Patient ID: Selena Peterson, female    DOB: 14-Jan-1991, 23 y.o.   MRN: [id number]  HPI JODYE SCALI is here for dental pain.  She reports right-sided dental pain. This has been present for quite some time, but has been worsening over the last week. She last saw a dentist about 9 months ago. At that time she was told she had multiple cavities, however she lost her insurance so she did not followup. Her current pain is on the right side. It is worse with chewing and cold foods. She reports swelling along the gum line as well as some mild swelling in her jaw. No fevers, nausea, vomiting.  Current Outpatient Prescriptions on File Prior to Visit  Medication Sig Dispense Refill  . aspirin 325 MG tablet Take 325 mg by mouth every 4 (four) hours as needed for pain.      . cetirizine (ZYRTEC) 10 MG tablet Take 1 tablet (10 mg total) by mouth daily.  30 tablet  11  . diphenhydrAMINE (BENADRYL) 25 MG tablet Take 25 mg by mouth every 6 (six) hours as needed for itching or allergies.      Marland Kitchen DM-Phenylephrine-Acetaminophen (TYLENOL COLD MULTI-SYMPTOM) 10-5-325 MG/15ML LIQD Take 15-30 mLs by mouth every 6 (six) hours as needed (cough).      Marland Kitchen ipratropium (ATROVENT) 0.06 % nasal spray Place 2 sprays into both nostrils 4 (four) times daily.  15 mL  1  . metroNIDAZOLE (FLAGYL) 500 MG tablet Take 1 tablet (500 mg total) by mouth 2 (two) times daily.  14 tablet  0  . metroNIDAZOLE (FLAGYL) 500 MG tablet Take 1 tablet (500 mg total) by mouth 2 (two) times daily.  14 tablet  0  . naproxen (NAPROSYN) 500 MG tablet Take 1 tablet (500 mg total) by mouth 2 (two) times daily with a meal.  30 tablet  0  . predniSONE (DELTASONE) 10 MG tablet Take 3 tablets (30 mg total) by mouth daily.  15 tablet  0  . pseudoephedrine-acetaminophen (TYLENOL SINUS) 30-500 MG TABS Take 1 tablet by mouth every 4 (four) hours as needed.      . traMADol (ULTRAM) 50 MG tablet Take 1 tablet (50 mg total) by mouth every 8 (eight)  hours as needed for pain.  30 tablet  0   No current facility-administered medications on file prior to visit.    I have reviewed and updated the following as appropriate: allergies and current medications SHx: current smoker   Review of Systems See HPI    Objective:   Physical Exam  HENT:  Mouth/Throat:     BP 133/84  Pulse 92  Temp(Src) 98.4 F (36.9 C) (Oral)  Wt 142 lb (64.411 kg)  LMP 03/01/2014 Gen: alert, cooperative, NAD Mouth: Multiple cavities on the right side, both upper and lower. Teeth marked in picture are tender to palpation. Mild swelling of the gum line, and no frank abscess or erythema.       Assessment & Plan:

## 2014-03-13 NOTE — Patient Instructions (Signed)
It was nice to meet you!  I put in a referral for you to see a dentist.  You should hear something in the next 1-2 weeks.  I sent in an antibiotic.  Take it 4 times a day for 10 days.  Alternate tylenol and ibuprofen every 4 hours.  Use Norco every 6 hours as needed for bad pain.  Follow up as needed.

## 2014-03-13 NOTE — Assessment & Plan Note (Signed)
Secondary to cavities. No obvious infection at this time, however does have some mild swelling. Penicillin 500 mg 4 times a day x10 days. Recommended alternating Tylenol and ibuprofen every 4 hours. Norco 5-325mg  #30 given to use as needed for pain. Referral to dentist placed. Followup as needed.

## 2014-03-18 ENCOUNTER — Encounter (HOSPITAL_COMMUNITY): Payer: Self-pay | Admitting: Emergency Medicine

## 2014-03-18 ENCOUNTER — Emergency Department (INDEPENDENT_AMBULATORY_CARE_PROVIDER_SITE_OTHER)
Admission: EM | Admit: 2014-03-18 | Discharge: 2014-03-18 | Disposition: A | Discharge status: Home / Self Care | Payer: Self-pay | Source: Home / Self Care | Attending: Family Medicine | Admitting: Family Medicine

## 2014-03-18 DIAGNOSIS — K2970 Gastritis, unspecified, without bleeding: Secondary | ICD-10-CM

## 2014-03-18 DIAGNOSIS — K2990 Gastroduodenitis, unspecified, without bleeding: Secondary | ICD-10-CM

## 2014-03-18 LAB — COMPREHENSIVE METABOLIC PANEL
ALT: 16 U/L (ref 0–35)
AST: 24 U/L (ref 0–37)
Albumin: 4.4 g/dL (ref 3.5–5.2)
Alkaline Phosphatase: 78 U/L (ref 39–117)
BUN: 7 mg/dL (ref 6–23)
CO2: 22 meq/L (ref 19–32)
Calcium: 9.8 mg/dL (ref 8.4–10.5)
Chloride: 98 mEq/L (ref 96–112)
Creatinine, Ser: 0.58 mg/dL (ref 0.50–1.10)
GFR calc Af Amer: >90 mL/min (ref >90)
Glucose, Bld: 105 mg/dL — ABNORMAL HIGH (ref 70–99)
POTASSIUM: 3.7 meq/L (ref 3.7–5.3)
SODIUM: 137 meq/L (ref 137–147)
Total Bilirubin: 0.3 mg/dL (ref 0.3–1.2)
Total Protein: 9.4 g/dL — ABNORMAL HIGH (ref 6.0–8.3)

## 2014-03-18 LAB — POCT I-STAT, CHEM 8
BUN: 4 mg/dL — ABNORMAL LOW (ref 6–23)
CHLORIDE: 101 meq/L (ref 96–112)
CREATININE: 0.6 mg/dL (ref 0.50–1.10)
Calcium, Ion: 1.13 mmol/L (ref 1.12–1.23)
GLUCOSE: 109 mg/dL — AB (ref 70–99)
HCT: 56 % — ABNORMAL HIGH (ref 36.0–46.0)
Hemoglobin: 19 g/dL — ABNORMAL HIGH (ref 12.0–15.0)
Potassium: 3.5 mEq/L — ABNORMAL LOW (ref 3.7–5.3)
Sodium: 138 mEq/L (ref 137–147)
TCO2: 20 mmol/L (ref 0–100)

## 2014-03-18 LAB — POCT URINALYSIS DIP (DEVICE)
GLUCOSE, UA: NEGATIVE mg/dL
Ketones, ur: NEGATIVE mg/dL
Leukocytes, UA: NEGATIVE
NITRITE: NEGATIVE
PH: 6 (ref 5.0–8.0)
Protein, ur: >=300 mg/dL — AB
Specific Gravity, Urine: >=1.03 (ref 1.005–1.030)
Urobilinogen, UA: 0.2 mg/dL (ref 0.0–1.0)

## 2014-03-18 LAB — LIPASE, BLOOD: Lipase: 12 U/L (ref 11–59)

## 2014-03-18 LAB — CBC
HCT: 47.6 % — ABNORMAL HIGH (ref 36.0–46.0)
Hemoglobin: 16.7 g/dL — ABNORMAL HIGH (ref 12.0–15.0)
MCH: 31.3 pg (ref 26.0–34.0)
MCHC: 35.1 g/dL (ref 30.0–36.0)
MCV: 89.1 fL (ref 78.0–100.0)
Platelets: 313 10*3/uL (ref 150–400)
RBC: 5.34 MIL/uL — ABNORMAL HIGH (ref 3.87–5.11)
RDW: 13.3 % (ref 11.5–15.5)
WBC: 7.7 10*3/uL (ref 4.0–10.5)

## 2014-03-18 LAB — POCT PREGNANCY, URINE: Preg Test, Ur: NEGATIVE

## 2014-03-18 MED ORDER — GI COCKTAIL ~~LOC~~
30.0000 mL | Freq: Once | ORAL | Status: AC
  Administered 2014-03-18: 30 mL via ORAL

## 2014-03-18 MED ORDER — GI COCKTAIL ~~LOC~~
ORAL | Status: AC
  Filled 2014-03-18: qty 30

## 2014-03-18 MED ORDER — OMEPRAZOLE 40 MG PO CPDR: 40.0000 mg | DELAYED_RELEASE_CAPSULE | Freq: Every day | ORAL | Status: DC

## 2014-03-18 MED ORDER — SUCRALFATE 1 G PO TABS: 1.0000 g | ORAL_TABLET | Freq: Three times a day (TID) | ORAL | Status: DC

## 2014-03-18 MED ORDER — SODIUM CHLORIDE 0.9 % IV BOLUS (SEPSIS)
1000.0000 mL | Freq: Once | INTRAVENOUS | Status: AC
  Administered 2014-03-18: 1000 mL via INTRAVENOUS

## 2014-03-18 NOTE — ED Notes (Signed)
Pt c/o abd pain onset Sunday night Sx include vomiting, diarrhea, chest pain?? Fevers Has been doing clear liquid diet and taking hydrocodone for pain Alert w/no signs of acute distress.

## 2014-03-18 NOTE — Discharge Instructions (Signed)
Thank you for coming in today. Take omaprazole daily.  Use caraftate 4x daily for 1-2 weeks.  Avoid alcohol and ibuprofen, aleve or aspirin.  Follow up with your doctor.  If your belly pain worsens, or you have high fever, bad vomiting, blood in your stool or black tarry stool go to the Emergency Room.   Gastritis, Adult Gastritis is soreness and swelling (inflammation) of the lining of the stomach. Gastritis can develop as a sudden onset (acute) or long-term (chronic) condition. If gastritis is not treated, it can lead to stomach bleeding and ulcers. CAUSES  Gastritis occurs when the stomach lining is weak or damaged. Digestive juices from the stomach then inflame the weakened stomach lining. The stomach lining may be weak or damaged due to viral or bacterial infections. One common bacterial infection is the Helicobacter pylori infection. Gastritis can also result from excessive alcohol consumption, taking certain medicines, or having too much acid in the stomach.  SYMPTOMS  In some cases, there are no symptoms. When symptoms are present, they may include:  Pain or a burning sensation in the upper abdomen.  Nausea.  Vomiting.  An uncomfortable feeling of fullness after eating. DIAGNOSIS  Your caregiver may suspect you have gastritis based on your symptoms and a physical exam. To determine the cause of your gastritis, your caregiver may perform the following:  Blood or stool tests to check for the H pylori bacterium.  Gastroscopy. A thin, flexible tube (endoscope) is passed down the esophagus and into the stomach. The endoscope has a light and camera on the end. Your caregiver uses the endoscope to view the inside of the stomach.  Taking a tissue sample (biopsy) from the stomach to examine under a microscope. TREATMENT  Depending on the cause of your gastritis, medicines may be prescribed. If you have a bacterial infection, such as an H pylori infection, antibiotics may be given. If  your gastritis is caused by too much acid in the stomach, H2 blockers or antacids may be given. Your caregiver may recommend that you stop taking aspirin, ibuprofen, or other nonsteroidal anti-inflammatory drugs (NSAIDs). HOME CARE INSTRUCTIONS  Only take over-the-counter or prescription medicines as directed by your caregiver.  If you were given antibiotic medicines, take them as directed. Finish them even if you start to feel better.  Drink enough fluids to keep your urine clear or pale yellow.  Avoid foods and drinks that make your symptoms worse, such as:  Caffeine or alcoholic drinks.  Chocolate.  Peppermint or mint flavorings.  Garlic and onions.  Spicy foods.  Citrus fruits, such as oranges, lemons, or limes.  Tomato-based foods such as sauce, chili, salsa, and pizza.  Fried and fatty foods.  Eat small, frequent meals instead of large meals. SEEK IMMEDIATE MEDICAL CARE IF:   You have black or dark red stools.  You vomit blood or material that looks like coffee grounds.  You are unable to keep fluids down.  Your abdominal pain gets worse.  You have a fever.  You do not feel better after 1 week.  You have any other questions or concerns. MAKE SURE YOU:  Understand these instructions.  Will watch your condition.  Will get help right away if you are not doing well or get worse. Document Released: 10/03/2001 Document Revised: 04/09/2012 Document Reviewed: 11/22/2011 [facility] Patient Information 2014 [place].  Abdominal Pain, Women Abdominal (stomach, pelvic, or belly) pain can be caused by many things. It is important to tell your doctor:  The  location of the pain.  Does it come and go or is it present all the time?  Are there things that start the pain (eating certain foods, exercise)?  Are there other symptoms associated with the pain (fever, nausea, vomiting, diarrhea)? All of this is helpful to know when trying to find the cause of the  pain. CAUSES   Stomach: virus or bacteria infection, or ulcer.  Intestine: appendicitis (inflamed appendix), regional ileitis (Crohn's disease), ulcerative colitis (inflamed colon), irritable bowel syndrome, diverticulitis (inflamed diverticulum of the colon), or cancer of the stomach or intestine.  Gallbladder disease or stones in the gallbladder.  Kidney disease, kidney stones, or infection.  Pancreas infection or cancer.  Fibromyalgia (pain disorder).  Diseases of the female organs:  Uterus: fibroid (non-cancerous) tumors or infection.  Fallopian tubes: infection or tubal pregnancy.  Ovary: cysts or tumors.  Pelvic adhesions (scar tissue).  Endometriosis (uterus lining tissue growing in the pelvis and on the pelvic organs).  Pelvic congestion syndrome (female organs filling up with blood just before the menstrual period).  Pain with the menstrual period.  Pain with ovulation (producing an egg).  Pain with an IUD (intrauterine device, birth control) in the uterus.  Cancer of the female organs.  Functional pain (pain not caused by a disease, may improve without treatment).  Psychological pain.  Depression. DIAGNOSIS  Your doctor will decide the seriousness of your pain by doing an examination.  Blood tests.  X-rays.  Ultrasound.  CT scan (computed tomography, special type of X-ray).  MRI (magnetic resonance imaging).  Cultures, for infection.  Barium enema (dye inserted in the large intestine, to better view it with X-rays).  Colonoscopy (looking in intestine with a lighted tube).  Laparoscopy (minor surgery, looking in abdomen with a lighted tube).  Major abdominal exploratory surgery (looking in abdomen with a large incision). TREATMENT  The treatment will depend on the cause of the pain.   Many cases can be observed and treated at home.  Over-the-counter medicines recommended by your caregiver.  Prescription medicine.  Antibiotics, for  infection.  Birth control pills, for painful periods or for ovulation pain.  Hormone treatment, for endometriosis.  Nerve blocking injections.  Physical therapy.  Antidepressants.  Counseling with a psychologist or psychiatrist.  Minor or major surgery. HOME CARE INSTRUCTIONS   Do not take laxatives, unless directed by your caregiver.  Take over-the-counter pain medicine only if ordered by your caregiver. Do not take aspirin because it can cause an upset stomach or bleeding.  Try a clear liquid diet (broth or water) as ordered by your caregiver. Slowly move to a bland diet, as tolerated, if the pain is related to the stomach or intestine.  Have a thermometer and take your temperature several times a day, and record it.  Bed rest and sleep, if it helps the pain.  Avoid sexual intercourse, if it causes pain.  Avoid stressful situations.  Keep your follow-up appointments and tests, as your caregiver orders.  If the pain does not go away with medicine or surgery, you may try:  Acupuncture.  Relaxation exercises (yoga, meditation).  Group therapy.  Counseling. SEEK MEDICAL CARE IF:   You notice certain foods cause stomach pain.  Your home care treatment is not helping your pain.  You need stronger pain medicine.  You want your IUD removed.  You feel faint or lightheaded.  You develop nausea and vomiting.  You develop a rash.  You are having side effects or an allergy to your  medicine. SEEK IMMEDIATE MEDICAL CARE IF:   Your pain does not go away or gets worse.  You have a fever.  Your pain is felt only in portions of the abdomen. The right side could possibly be appendicitis. The left lower portion of the abdomen could be colitis or diverticulitis.  You are passing blood in your stools (bright red or black tarry stools, with or without vomiting).  You have blood in your urine.  You develop chills, with or without a fever.  You pass out. MAKE SURE  YOU:   Understand these instructions.  Will watch your condition.  Will get help right away if you are not doing well or get worse. Document Released: 08/06/2007 Document Revised: 01/01/2012 Document Reviewed: 08/26/2009 [facility] Patient Information 2014 [place], [place].

## 2014-03-18 NOTE — ED Provider Notes (Signed)
Selena Peterson is a 23 y.o. female who presents to Urgent Care today for abdominal pain associated with vomiting and diarrhea. She developed epigastric pain recently. She denies any blood in her stool or vomit. No fevers or chills. No trouble breathing. She has tried Tylenol which has not helped much. She recently was treated for hydrocodone and penicillin for dental pain. She feels well otherwise.   Past Medical History  Diagnosis Date  . Chlamydia   . BV (bacterial vaginosis)    History  Substance Use Topics  . Smoking status: Current Every Day Smoker -- 0.30 packs/day    Types: Cigarettes  . Smokeless tobacco: Not on file  . Alcohol Use: Yes   ROS as above Medications: No current facility-administered medications for this encounter.   Current Outpatient Prescriptions  Medication Sig Dispense Refill  . omeprazole (PRILOSEC) 40 MG capsule Take 1 capsule (40 mg total) by mouth daily.  30 capsule  1  . sucralfate (CARAFATE) 1 G tablet Take 1 tablet (1 g total) by mouth 4 (four) times daily -  with meals and at bedtime.  60 tablet  0    Exam:  BP 138/99  Pulse 110  Temp(Src) 98.2 F (36.8 C) (Oral)  Resp 20  SpO2 99%  LMP 03/01/2014 Gen: Well NAD HEENT: EOMI,  MMM Lungs: Normal work of breathing. CTABL Heart: RRR no MRG Abd: NABS, Soft. Nondistended. Mildly tender upper left quadrant. No rebound or guarding. Exts: Brisk capillary refill, warm and well perfused.   Patient was given a 1 L normal saline bolus and a GI cocktail and felt much better  Results for orders placed during the hospital encounter of 03/18/14 (from the past 24 hour(s))  POCT URINALYSIS DIP (DEVICE)     Status: Abnormal   Collection Time    03/18/14  9:24 AM      Result Value Ref Range   Glucose, UA NEGATIVE  NEGATIVE mg/dL   Bilirubin Urine SMALL (*) NEGATIVE   Ketones, ur NEGATIVE  NEGATIVE mg/dL   Specific Gravity, Urine >=1.030  1.005 - 1.030   Hgb urine dipstick SMALL (*) NEGATIVE   pH 6.0  5.0  - 8.0   Protein, ur >=300 (*) NEGATIVE mg/dL   Urobilinogen, UA 0.2  0.0 - 1.0 mg/dL   Nitrite NEGATIVE  NEGATIVE   Leukocytes, UA NEGATIVE  NEGATIVE  POCT PREGNANCY, URINE     Status: None   Collection Time    03/18/14  9:28 AM      Result Value Ref Range   Preg Test, Ur NEGATIVE  NEGATIVE  CBC     Status: Abnormal   Collection Time    03/18/14  9:33 AM      Result Value Ref Range   WBC 7.7  4.0 - 10.5 K/uL   RBC 5.34 (*) 3.87 - 5.11 MIL/uL   Hemoglobin 16.7 (*) 12.0 - 15.0 g/dL   HCT 47.6 (*) 36.0 - 46.0 %   MCV 89.1  78.0 - 100.0 fL   MCH 31.3  26.0 - 34.0 pg   MCHC 35.1  30.0 - 36.0 g/dL   RDW 13.3  11.5 - 15.5 %   Platelets 313  150 - 400 K/uL  COMPREHENSIVE METABOLIC PANEL     Status: Abnormal   Collection Time    03/18/14  9:33 AM      Result Value Ref Range   Sodium 137  137 - 147 mEq/L   Potassium 3.7  3.7 - 5.3  mEq/L   Chloride 98  96 - 112 mEq/L   CO2 22  19 - 32 mEq/L   Glucose, Bld 105 (*) 70 - 99 mg/dL   BUN 7  6 - 23 mg/dL   Creatinine, Ser 0.58  0.50 - 1.10 mg/dL   Calcium 9.8  8.4 - 10.5 mg/dL   Total Protein 9.4 (*) 6.0 - 8.3 g/dL   Albumin 4.4  3.5 - 5.2 g/dL   AST 24  0 - 37 U/L   ALT 16  0 - 35 U/L   Alkaline Phosphatase 78  39 - 117 U/L   Total Bilirubin 0.3  0.3 - 1.2 mg/dL   GFR calc non Af Amer >90  >90 mL/min   GFR calc Af Amer >90  >90 mL/min  LIPASE, BLOOD     Status: None   Collection Time    03/18/14  9:33 AM      Result Value Ref Range   Lipase 12  11 - 59 U/L  POCT I-STAT, CHEM 8     Status: Abnormal   Collection Time    03/18/14 10:00 AM      Result Value Ref Range   Sodium 138  137 - 147 mEq/L   Potassium 3.5 (*) 3.7 - 5.3 mEq/L   Chloride 101  96 - 112 mEq/L   BUN 4 (*) 6 - 23 mg/dL   Creatinine, Ser 0.60  0.50 - 1.10 mg/dL   Glucose, Bld 109 (*) 70 - 99 mg/dL   Calcium, Ion 1.13  1.12 - 1.23 mmol/L   TCO2 20  0 - 100 mmol/L   Hemoglobin 19.0 (*) 12.0 - 15.0 g/dL   HCT 56.0 (*) 36.0 - 46.0 %   No results  found.  Assessment and Plan: 23 y.o. female with gastritis. Plan to treat with Carafate and omeprazole.  Discussed warning signs or symptoms. Please see discharge instructions. Patient expresses understanding.    Gregor Hams, MD 03/18/14 1055

## 2014-03-19 ENCOUNTER — Emergency Department (HOSPITAL_COMMUNITY): Payer: Medicaid Other

## 2014-03-19 ENCOUNTER — Inpatient Hospital Stay (HOSPITAL_COMMUNITY)
Admission: EM | Admit: 2014-03-19 | Discharge: 2014-03-21 | DRG: 282 | Disposition: A | Discharge status: Home / Self Care | Payer: Self-pay | Attending: Cardiology | Admitting: Cardiology

## 2014-03-19 ENCOUNTER — Encounter (HOSPITAL_COMMUNITY): Payer: Self-pay | Admitting: Emergency Medicine

## 2014-03-19 DIAGNOSIS — I214 Non-ST elevation (NSTEMI) myocardial infarction: Principal | ICD-10-CM | POA: Diagnosis present

## 2014-03-19 DIAGNOSIS — R778 Other specified abnormalities of plasma proteins: Secondary | ICD-10-CM

## 2014-03-19 DIAGNOSIS — Z8249 Family history of ischemic heart disease and other diseases of the circulatory system: Secondary | ICD-10-CM

## 2014-03-19 DIAGNOSIS — F17200 Nicotine dependence, unspecified, uncomplicated: Secondary | ICD-10-CM | POA: Diagnosis present

## 2014-03-19 DIAGNOSIS — R799 Abnormal finding of blood chemistry, unspecified: Secondary | ICD-10-CM

## 2014-03-19 DIAGNOSIS — R7989 Other specified abnormal findings of blood chemistry: Secondary | ICD-10-CM

## 2014-03-19 DIAGNOSIS — F1210 Cannabis abuse, uncomplicated: Secondary | ICD-10-CM | POA: Diagnosis present

## 2014-03-19 DIAGNOSIS — R072 Precordial pain: Secondary | ICD-10-CM

## 2014-03-19 DIAGNOSIS — R079 Chest pain, unspecified: Secondary | ICD-10-CM | POA: Diagnosis present

## 2014-03-19 DIAGNOSIS — Z833 Family history of diabetes mellitus: Secondary | ICD-10-CM

## 2014-03-19 HISTORY — DX: Other specified abnormalities of plasma proteins: R77.8

## 2014-03-19 LAB — COMPREHENSIVE METABOLIC PANEL
ALT: 16 U/L (ref 0–35)
AST: 25 U/L (ref 0–37)
Albumin: 3.7 g/dL (ref 3.5–5.2)
Alkaline Phosphatase: 61 U/L (ref 39–117)
BUN: 7 mg/dL (ref 6–23)
CHLORIDE: 102 meq/L (ref 96–112)
CO2: 24 mEq/L (ref 19–32)
CREATININE: 0.58 mg/dL (ref 0.50–1.10)
Calcium: 9.1 mg/dL (ref 8.4–10.5)
GFR calc non Af Amer: >90 mL/min (ref >90)
Glucose, Bld: 96 mg/dL (ref 70–99)
Potassium: 3.4 mEq/L — ABNORMAL LOW (ref 3.7–5.3)
Sodium: 139 mEq/L (ref 137–147)
Total Bilirubin: <0.2 mg/dL — ABNORMAL LOW (ref 0.3–1.2)
Total Protein: 7.7 g/dL (ref 6.0–8.3)

## 2014-03-19 LAB — URINALYSIS, ROUTINE W REFLEX MICROSCOPIC
Bilirubin Urine: NEGATIVE
Glucose, UA: NEGATIVE mg/dL
Ketones, ur: 15 mg/dL — AB
Nitrite: NEGATIVE
PH: 6 (ref 5.0–8.0)
PROTEIN: 30 mg/dL — AB
Specific Gravity, Urine: 1.038 — ABNORMAL HIGH (ref 1.005–1.030)
Urobilinogen, UA: 0.2 mg/dL (ref 0.0–1.0)

## 2014-03-19 LAB — RAPID URINE DRUG SCREEN, HOSP PERFORMED
AMPHETAMINES: NOT DETECTED
BENZODIAZEPINES: NOT DETECTED
Barbiturates: POSITIVE — AB
COCAINE: NOT DETECTED
Opiates: NOT DETECTED
Tetrahydrocannabinol: POSITIVE — AB

## 2014-03-19 LAB — SEDIMENTATION RATE: SED RATE: 9 mm/h (ref 0–22)

## 2014-03-19 LAB — URINE MICROSCOPIC-ADD ON

## 2014-03-19 LAB — CBC WITH DIFFERENTIAL/PLATELET
Basophils Absolute: 0 10*3/uL (ref 0.0–0.1)
Basophils Relative: 1 % (ref 0–1)
EOS ABS: 0 10*3/uL (ref 0.0–0.7)
Eosinophils Relative: 1 % (ref 0–5)
HCT: 41.7 % (ref 36.0–46.0)
Hemoglobin: 14.7 g/dL (ref 12.0–15.0)
LYMPHS PCT: 29 % (ref 12–46)
Lymphs Abs: 2 10*3/uL (ref 0.7–4.0)
MCH: 31.3 pg (ref 26.0–34.0)
MCHC: 35.3 g/dL (ref 30.0–36.0)
MCV: 88.7 fL (ref 78.0–100.0)
Monocytes Absolute: 1.2 10*3/uL — ABNORMAL HIGH (ref 0.1–1.0)
Monocytes Relative: 17 % — ABNORMAL HIGH (ref 3–12)
NEUTROS PCT: 53 % (ref 43–77)
Neutro Abs: 3.7 10*3/uL (ref 1.7–7.7)
PLATELETS: 271 10*3/uL (ref 150–400)
RBC: 4.7 MIL/uL (ref 3.87–5.11)
RDW: 13.4 % (ref 11.5–15.5)
WBC: 6.9 10*3/uL (ref 4.0–10.5)

## 2014-03-19 LAB — D-DIMER, QUANTITATIVE: D-Dimer, Quant: <0.27 ug/mL-FEU (ref 0.00–0.48)

## 2014-03-19 LAB — TROPONIN I
TROPONIN I: 2.52 ng/mL — AB (ref <0.30)
TROPONIN I: 2.44 ng/mL — AB (ref <0.30)
Troponin I: 1.21 ng/mL (ref <0.30)
Troponin I: 1.26 ng/mL (ref <0.30)

## 2014-03-19 LAB — CK TOTAL AND CKMB (NOT AT ARMC)
CK, MB: 13.3 ng/mL (ref 0.3–4.0)
CK, MB: 9.3 ng/mL (ref 0.3–4.0)
RELATIVE INDEX: 6.1 — AB (ref 0.0–2.5)
Relative Index: 8.4 — ABNORMAL HIGH (ref 0.0–2.5)
Total CK: 158 U/L (ref 7–177)
Total CK: 153 U/L (ref 7–177)

## 2014-03-19 LAB — LIPASE, BLOOD: Lipase: 15 U/L (ref 11–59)

## 2014-03-19 MED ORDER — DIPHENHYDRAMINE HCL 50 MG/ML IJ SOLN
25.0000 mg | Freq: Once | INTRAMUSCULAR | Status: AC
  Administered 2014-03-19: 25 mg via INTRAVENOUS
  Filled 2014-03-19: qty 1

## 2014-03-19 MED ORDER — ZOLPIDEM TARTRATE 5 MG PO TABS
5.0000 mg | ORAL_TABLET | Freq: Every evening | ORAL | Status: DC | PRN
  Administered 2014-03-20: 5 mg via ORAL
  Filled 2014-03-19: qty 1

## 2014-03-19 MED ORDER — ALPRAZOLAM 0.25 MG PO TABS
0.2500 mg | ORAL_TABLET | Freq: Two times a day (BID) | ORAL | Status: DC | PRN
  Administered 2014-03-20: 0.25 mg via ORAL
  Filled 2014-03-19: qty 1

## 2014-03-19 MED ORDER — POTASSIUM CHLORIDE CRYS ER 20 MEQ PO TBCR
40.0000 meq | EXTENDED_RELEASE_TABLET | Freq: Once | ORAL | Status: AC
  Administered 2014-03-19: 40 meq via ORAL
  Filled 2014-03-19: qty 2

## 2014-03-19 MED ORDER — HEPARIN (PORCINE) IN NACL 100-0.45 UNIT/ML-% IJ SOLN
900.0000 [IU]/h | INTRAMUSCULAR | Status: DC
  Administered 2014-03-19: 700 [IU]/h via INTRAVENOUS
  Filled 2014-03-19: qty 250

## 2014-03-19 MED ORDER — GI COCKTAIL ~~LOC~~
30.0000 mL | Freq: Once | ORAL | Status: AC
  Administered 2014-03-19: 30 mL via ORAL
  Filled 2014-03-19: qty 30

## 2014-03-19 MED ORDER — IOHEXOL 350 MG/ML SOLN
80.0000 mL | Freq: Once | INTRAVENOUS | Status: AC | PRN
  Administered 2014-03-19: 100 mL via INTRAVENOUS

## 2014-03-19 MED ORDER — GI COCKTAIL ~~LOC~~
30.0000 mL | Freq: Two times a day (BID) | ORAL | Status: DC | PRN
  Administered 2014-03-19: 30 mL via ORAL
  Filled 2014-03-19: qty 30

## 2014-03-19 MED ORDER — SODIUM CHLORIDE 0.9 % IV BOLUS (SEPSIS)
1000.0000 mL | Freq: Once | INTRAVENOUS | Status: AC
  Administered 2014-03-19: 1000 mL via INTRAVENOUS

## 2014-03-19 MED ORDER — ENOXAPARIN SODIUM 40 MG/0.4ML ~~LOC~~ SOLN
40.0000 mg | SUBCUTANEOUS | Status: DC
  Administered 2014-03-19: 40 mg via SUBCUTANEOUS
  Filled 2014-03-19: qty 0.4

## 2014-03-19 MED ORDER — ONDANSETRON HCL 4 MG/2ML IJ SOLN
4.0000 mg | Freq: Four times a day (QID) | INTRAMUSCULAR | Status: DC | PRN
Start: 2014-03-19 — End: 2014-03-21

## 2014-03-19 MED ORDER — ASPIRIN 81 MG PO CHEW
324.0000 mg | CHEWABLE_TABLET | Freq: Once | ORAL | Status: AC
  Administered 2014-03-19: 324 mg via ORAL
  Filled 2014-03-19: qty 4

## 2014-03-19 MED ORDER — ACETAMINOPHEN 325 MG PO TABS
650.0000 mg | ORAL_TABLET | ORAL | Status: DC | PRN
  Administered 2014-03-21: 650 mg via ORAL
  Filled 2014-03-19: qty 2

## 2014-03-19 MED ORDER — METOCLOPRAMIDE HCL 5 MG/ML IJ SOLN
10.0000 mg | Freq: Once | INTRAMUSCULAR | Status: AC
  Administered 2014-03-19: 10 mg via INTRAVENOUS
  Filled 2014-03-19: qty 2

## 2014-03-19 MED ORDER — SODIUM CHLORIDE 0.9 % IV SOLN
INTRAVENOUS | Status: DC
  Administered 2014-03-19: 18:00:00 via INTRAVENOUS

## 2014-03-19 NOTE — ED Notes (Signed)
Tech in room to perform echo

## 2014-03-19 NOTE — Progress Notes (Signed)
ANTICOAGULATION CONSULT NOTE - Initial Consult  Pharmacy Consult for heparin Indication: chest pain/ACS  No Known Allergies  Patient Measurements: Height: 5' (152.4 cm) Weight: 138 lb 14.4 oz (63.005 kg) IBW/kg (Calculated) : 45.5 Heparin Dosing Weight: 58.7 kg  Vital Signs: Temp: 98.6 F (37 C) (05/28 2000) Temp src: Oral (05/28 1725) BP: 139/84 mmHg (05/28 2000) Pulse Rate: 76 (05/28 2000)  Labs:  Recent Labs  03/18/14 0933 03/18/14 1000  03/19/14 0816 03/19/14 0925 03/19/14 1254 03/19/14 1525 03/19/14 2142  HGB 16.7* 19.0*  --  14.7  --   --   --   --   HCT 47.6* 56.0*  --  41.7  --   --   --   --   PLT 313  --   --  271  --   --   --   --   CREATININE 0.58 0.60  --  0.58  --   --   --   --   CKTOTAL  --   --   --   --   --  158  --   --   CKMB  --   --   --   --   --  13.3*  --   --   TROPONINI  --   --   < > 2.52* 2.44*  --  1.21* 1.26*  < > = values in this interval not displayed.  Estimated Creatinine Clearance: 89.9 ml/min (by C-G formula based on Cr of 0.58).   Medical History: Past Medical History  Diagnosis Date  . Chlamydia   . BV (bacterial vaginosis)     Medications:  See PTA medication list  Assessment: 23 y/o female who presented to [facility] this morning with chest pain and headache associated with N/V/D. Pharmacy consulted to begin IV heparin for CP of unclear etiology with elevated troponins which are now trending down. Renal function and CBC are normal. She had Lovenox 40 mg SQ at 17:45 so will not give a bolus.   Goal of Therapy:  Heparin level 0.3-0.7 units/ml Monitor platelets by anticoagulation protocol: Yes   Plan:  - Heparin IV at 700 units/hr - 6 hr heparin level - Daily heparin level and CBC  [facility], [place].D., BCPS Clinical Pharmacist Pager: [phone] 03/19/2014 10:29 PM

## 2014-03-19 NOTE — ED Notes (Signed)
Troponin of 2.5 reported to Dr Wilson Singer, pt remains chest pain free at this time

## 2014-03-19 NOTE — Progress Notes (Signed)
Spoke w/ Dr. Radford Pax.  Echo OK  Troponin repeated from a separate draw and still elevated. CKMB elevated but CK normal.  D-dimer and ESR pending.   Will admit to obs overnight. CT chest and head.  Further evaluation and treatment depending on the results.  If 3rd troponin trending up, consider heparin.  If troponin levels remain flat, MD advise on further evaluation.

## 2014-03-19 NOTE — ED Notes (Signed)
Pt transported to xray 

## 2014-03-19 NOTE — Progress Notes (Signed)
Chest CT anigo with no evidence of PE.  Etiology of CP unclear. 2D echo is normal and EKG is nonischemic but Troponin is elevated.  Troponin trending downward.  Her CPK is normal and MB mildly elevated. Will change to IV Heparin gtt per pharmacy overnight and keep NPO for possible cath in am.   Recheck CPK and MB and also get a urine drug screen

## 2014-03-19 NOTE — Progress Notes (Signed)
agree

## 2014-03-19 NOTE — ED Provider Notes (Signed)
CSN: [id number]     Arrival date & time 03/19/14  0700 History   First MD Initiated Contact with Patient 03/19/14 [phone]     Chief Complaint  Patient presents with  . Chest Pain  . Headache     (Consider location/radiation/quality/duration/timing/severity/associated sxs/prior Treatment) HPI Comments: Patient is a 23 yo F medical history significant for marijuana use, tobacco use presenting to the ED for central chest pressure and a generalized throbbing headache with associated myalgias. Patient states she has been having these symptoms since Monday, yesterday she developed nausea, with two to three episodes NBNB vomiting,. Patient was seen at [facility] treated with IV Fluids, carafate and had improvement of her nausea and vomiting. Patient states she has had continued intermittent central CP and HA since then. She did have some subjective fevers and chills at the beginning of the week but states this has resolved. Patient has not started the Prilosec and Carafate prescribed her an urgent care center. Alleviating factors: None. Aggravating factors: Deep inspiration, palpation of her chest wall. Medications tried prior to arrival: none. PERC negative. Grandmother has a history of CHF.      Past Medical History  Diagnosis Date  . Chlamydia   . BV (bacterial vaginosis)    Past Surgical History  Procedure Laterality Date  . Ankle fracture surgery     Family History  Problem Relation Age of Onset  . Diabetes Maternal Grandmother   . Hypertension Maternal Grandmother   . GER disease Mother   . Heart failure Maternal Grandmother    History  Substance Use Topics  . Smoking status: Current Every Day Smoker -- 0.30 packs/day for 4 years    Types: Cigarettes  . Smokeless tobacco: Not on file  . Alcohol Use: Yes     Comment: Drinks 1 pint of liquor per week   OB History   Grav Para Term Preterm Abortions TAB SAB Ect Mult Living                 Review of Systems  Respiratory: Negative for  shortness of breath.   Cardiovascular: Positive for chest pain. Negative for leg swelling.  Neurological: Positive for headaches.  All other systems reviewed and are negative.     Allergies  Review of patient's allergies indicates no known allergies.  Home Medications   Prior to Admission medications   Medication Sig Start Date End Date Taking? Authorizing Provider  omeprazole (PRILOSEC) 40 MG capsule Take 1 capsule (40 mg total) by mouth daily. 03/18/14   Gregor Hams, MD  sucralfate (CARAFATE) 1 G tablet Take 1 tablet (1 g total) by mouth 4 (four) times daily -  with meals and at bedtime. 03/18/14   Gregor Hams, MD   BP 119/73  Pulse 87  Temp(Src) 97.7 F (36.5 C) (Oral)  Resp 22  SpO2 100%  LMP 03/01/2014 Physical Exam  Nursing note and vitals reviewed. Constitutional: She is oriented to person, place, and time. She appears well-developed and well-nourished. No distress.  HENT:  Head: Normocephalic and atraumatic.  Right Ear: External ear normal.  Left Ear: External ear normal.  Nose: Nose normal.  Mouth/Throat: Oropharynx is clear and moist. No oropharyngeal exudate.  Eyes: Conjunctivae and EOM are normal. Pupils are equal, round, and reactive to light.  Neck: Normal range of motion. Neck supple.  Cardiovascular: Normal rate, regular rhythm, normal heart sounds and intact distal pulses.   Pulmonary/Chest: Effort normal and breath sounds normal. No respiratory distress.  Abdominal: Soft.  There is no tenderness.  Musculoskeletal: Normal range of motion. She exhibits no edema.  Neurological: She is alert and oriented to person, place, and time. She has normal strength. No cranial nerve deficit. Gait normal. GCS eye subscore is 4. GCS verbal subscore is 5. GCS motor subscore is 6.  Sensation grossly intact.  No pronator drift.  Bilateral heel-knee-shin intact.  Skin: Skin is warm and dry. She is not diaphoretic.  Psychiatric: Her speech is normal.    ED Course   Procedures (including critical care time) Medications  sodium chloride 0.9 % bolus 1,000 mL (0 mLs Intravenous Stopped 03/19/14 0916)  gi cocktail (Maalox,Lidocaine,Donnatal) (30 mLs Oral Given 03/19/14 0814)  metoCLOPramide (REGLAN) injection 10 mg (10 mg Intravenous Given 03/19/14 0814)  diphenhydrAMINE (BENADRYL) injection 25 mg (25 mg Intravenous Given 03/19/14 0814)  aspirin chewable tablet 324 mg (324 mg Oral Given 03/19/14 0939)    Labs Review Labs Reviewed  COMPREHENSIVE METABOLIC PANEL - Abnormal; Notable for the following:    Potassium 3.4 (*)    Total Bilirubin <0.2 (*)    All other components within normal limits  CBC WITH DIFFERENTIAL - Abnormal; Notable for the following:    Monocytes Relative 17 (*)    Monocytes Absolute 1.2 (*)    All other components within normal limits  URINALYSIS, ROUTINE W REFLEX MICROSCOPIC - Abnormal; Notable for the following:    APPearance CLOUDY (*)    Specific Gravity, Urine 1.038 (*)    Hgb urine dipstick SMALL (*)    Ketones, ur 15 (*)    Protein, ur 30 (*)    Leukocytes, UA MODERATE (*)    All other components within normal limits  TROPONIN I - Abnormal; Notable for the following:    Troponin I 2.52 (*)    All other components within normal limits  URINE MICROSCOPIC-ADD ON - Abnormal; Notable for the following:    Squamous Epithelial / LPF MANY (*)    Bacteria, UA MANY (*)    Crystals CA OXALATE CRYSTALS (*)    All other components within normal limits  URINE RAPID DRUG SCREEN (HOSP PERFORMED) - Abnormal; Notable for the following:    Tetrahydrocannabinol POSITIVE (*)    Barbiturates POSITIVE (*)    All other components within normal limits  TROPONIN I - Abnormal; Notable for the following:    Troponin I 2.44 (*)    All other components within normal limits  CK TOTAL AND CKMB - Abnormal; Notable for the following:    CK, MB 13.3 (*)    Relative Index 8.4 (*)    All other components within normal limits  LIPASE, BLOOD  D-DIMER,  QUANTITATIVE  TROPONIN I  TROPONIN I    Imaging Review Dg Chest 2 View  03/19/2014   CLINICAL DATA:  Chest pain and fever.  EXAM: CHEST - 2 VIEW  COMPARISON:  None  FINDINGS: The heart size and mediastinal contours are within normal limits. There is no evidence of pulmonary edema, consolidation, pneumothorax, nodule or pleural fluid. The visualized skeletal structures are unremarkable.  IMPRESSION: No active disease.   Electronically Signed   By: Aletta Edouard M.D.   On: 03/19/2014 08:38     EKG Interpretation   Date/Time:  Thursday Mar 19 2014 07:05:56 EDT Ventricular Rate:  86 PR Interval:  156 QRS Duration: 84 QT Interval:  368 QTC Calculation: 440 R Axis:   52 Text Interpretation:  Normal sinus rhythm Non-specific ST-t changes  Confirmed by Wilson Singer  MD, STEPHEN (  [id number]) on 03/19/2014 9:52:40 AM      MDM   Final diagnoses:  Chest pain    Filed Vitals:   03/19/14 1155  BP: 119/73  Pulse: 87  Temp:   Resp: 22   Afebrile, NAD, non-toxic appearing, AAOx4.   Patient is a 23 year old female presented to the emergency department with intermittent episodes of chest pressure with associated gastritis-like symptoms over the last 4 days. Patient is PERC negative. No cardiac history. Grandmother has CHF, no other familial history. Patient is comfortably sitting in room on RA w/o signs of respiratory distress. No lower extremity edema. Lungs clear to auscultation. Chest wall is tender with palpation. Cardiac exam is unremarkable. EKG unremarkable. X-ray unremarkable. Troponin elevated at 2.52, repeat troponin ordered and is also elevated at 2.44. 324mg  ASA given. Cardiology was consulted to help with management of patient.   Cardiology will admit the patient to their service overnight for observation, as discussed with Rosaria Ferries, PA-C.  Patient d/w with Dr. Wilson Singer, agrees with plan.       Harlow Mares, PA-C 03/19/14 1605

## 2014-03-19 NOTE — Progress Notes (Signed)
  Echocardiogram 2D Echocardiogram has been performed.  Selena Peterson 03/19/2014, 1:37 PM

## 2014-03-19 NOTE — ED Notes (Signed)
Patient transported to CT 

## 2014-03-19 NOTE — H&P (Addendum)
Patient seen, interviewed, examined and data reviewed.  Patient with atypical chest pain and EKG that is nonischemia with history of chest pain off and on for years with recent chest pressure and burning and felt to be due to gastritis and was associated with abdominal discomfort which has since resolved but this am her chest pain worsened and she presented to the ER.  She is an occasional smoker but has no family history of CAD and no PMH for herself.  Initial Troponin x 2 are elevated c/w NSTEMI but AST is normal.  Will recheck Troponin with CPK and MB as well as a d-dimer.  She has not had any LE edema or recent prolonged travel.  WIll check 2D echo.

## 2014-03-19 NOTE — ED Notes (Signed)
Report called to RN.

## 2014-03-19 NOTE — ED Notes (Signed)
Pt here from home with c/o chest pain and headache , pt was at urgent care with n/v/d which has subsided ,

## 2014-03-19 NOTE — ED Notes (Signed)
Patient transported to X-ray 

## 2014-03-19 NOTE — H&P (Signed)
History and Physical   Patient ID: Selena Peterson MRN: [id number], DOB/AGE: 01-31-1991 23 y.o. Date of Encounter: 03/19/2014  Primary Physician: Dargan Primary Cardiologist: new  Chief Complaint:  Chest pain  HPI: Selena Peterson is a 23 y.o. female smoker with no significant PMH who presented to the ED complaining of chest pain. The chest pain began on Monday morning while she was at work with associated N/V, abdominal pain, and headache. She felt like she "needed to burp but couldn't". The pain was midsternal,  "pressure like" and did not radiate. Over the next few days, her CP continued as well as the N/V, abdominal pain, headache as well as diarrhea. She went to urgent care yesterday 03/18/14 and was diagnosed with gastritis. She remembers eating sushi at a [place] buffet this past weekend and found an ant on her food which she figured was the cause of her gastritis. She was given sucralfate and omeprazole and the abdominal pain/vomiting/diarrhea subsided by the end of the day. However, the chest pain and headache persisted. This morning the chest pain became unbearable and she came to the ED. On presentation to the ED, troponin was markedly elevated and EKG showed NSR with possible non-specific T wave changes. She was given ASA, Benadryl, Reglan, and a GI cocktail. She is currently complaining of off/on midsternal chest pain but resting comfortably.  Of note, she has had intermittent episodes of chest pain over the past 6-7 years which have been similar in character to the present chest pain but much less severe. Occasionally, the episodes occurred every other day and lasted 15-20 min. They are triggered by exertion (particularly walking up stairs/hills) and are occasionally made worse by deep breathing and eating. They are relieved spontaneously. They are occasionally associated with SOB and palpitations. She was on the step team in high school and would always experience  chest pain during practice. Overall, she is very active and tries to eat healthy.   Of note, the pain is worse when lying back and better with sitting up.   Past Medical History  Diagnosis Date  . Chlamydia   . BV (bacterial vaginosis)     Surgical History:  Past Surgical History  Procedure Laterality Date  . Ankle fracture surgery       I have reviewed the patient's current medications. Prior to Admission medications   Medication Sig Start Date End Date Taking? Authorizing Provider  acetaminophen (TYLENOL) 500 MG tablet Take 500 mg by mouth every 6 (six) hours as needed for moderate pain.   Yes Historical Provider, MD   Allergies: No Known Allergies  History   Social History  . Marital Status: Single    Spouse Name: N/A    Number of Children: N/A  . Years of Education: N/A   Occupational History  . Not on file.   Social History Main Topics  . Smoking status: Current Every Day Smoker -- 0.30 packs/day for 4 years    Types: Cigarettes  . Smokeless tobacco: Not on file  . Alcohol Use: Yes     Comment: Drinks 1 pint of liquor per week  . Drug Use: No     Comment: History of frequent marijuana use  . Sexual Activity: Yes    Birth Control/ Protection: None   Other Topics Concern  . Not on file   Social History Narrative   Works at the [employer]. Lives alone.     Family History  Problem Relation Age of Onset  . Diabetes Maternal Grandmother   . Hypertension Maternal Grandmother   . GER disease Mother   . Heart failure Maternal Grandmother    Family Status  Relation Status Death Age  . Maternal Grandmother Alive   . Mother Alive   . Father Alive   . Sister Alive     Review of Systems:    Patient reports snoring loudly at night with possible apneic episodes which has gotten worse over the past 2 years. Self reported, not witnessed by others.  Full 14-point review of systems otherwise negative except as noted above.  Physical Exam: Blood  pressure 119/73, pulse 87, temperature 97.7 F (36.5 C), temperature source Oral, resp. rate 22, last menstrual period 03/01/2014, SpO2 100.00%. General: Well developed, well nourished,female in no acute distress. Head: Normocephalic, atraumatic, sclera non-icteric, no xanthomas, nares are without discharge. Dentition: good Neck: No carotid bruits. JVD not elevated. No thyromegally Lungs: Good expansion bilaterally without wheezes or rhonchi.  Heart: Regular rate and rhythm with S1 S2.  No S3 or S4.  No rubs, or gallops appreciated. Grade 1-2 systolic murmur LUSB.  Abdomen: Soft, non-tender, non-distended with normoactive bowel sounds. No hepatomegaly. No rebound/guarding. No obvious abdominal masses. Msk:  Strength and tone appear normal for age. No joint deformities or effusions, no spine or costo-vertebral angle tenderness. Extremities: No clubbing or cyanosis. No edema.  Distal pedal pulses are 2+ in 4 extrem Neuro: Alert and oriented X 3. Moves all extremities spontaneously. No focal deficits noted. Psych:  Responds to questions appropriately with a normal affect. Skin: No rashes or lesions noted  Labs:   Lab Results  Component Value Date   WBC 6.9 03/19/2014   HGB 14.7 03/19/2014   HCT 41.7 03/19/2014   MCV 88.7 03/19/2014   PLT 271 03/19/2014      Recent Labs Lab 03/19/14 0816  NA 139  K 3.4*  CL 102  CO2 24  BUN 7  CREATININE 0.58  CALCIUM 9.1  PROT 7.7  BILITOT <0.2*  ALKPHOS 61  ALT 16  AST 25  GLUCOSE 96    Recent Labs  03/19/14 0816 03/19/14 0925  TROPONINI 2.52* 2.44*    Radiology/Studies: Dg Chest 2 View 03/19/2014   CLINICAL DATA:  Chest pain and fever.  EXAM: CHEST - 2 VIEW  COMPARISON:  None  FINDINGS: The heart size and mediastinal contours are within normal limits. There is no evidence of pulmonary edema, consolidation, pneumothorax, nodule or pleural fluid. The visualized skeletal structures are unremarkable.  IMPRESSION: No active disease.    Electronically Signed   By: Aletta Edouard M.D.   On: 03/19/2014 08:38   ECG:  03/19/14 NSR T wave flattening V4-6  ASSESSMENT AND PLAN:  Principal Problem:   Chest pain - ECG not acute, troponin is elevated. Will ck stat echo, CKMB and d-dimer. Further evaluation and treatment based on results.     Elevated troponin - no previous values for comparison, 2 different specimens sent, both abnl. Will ck CKMB and recheck troponin 6 hr after last value to follow trend.   Plan - MD advise on admission.   Rondel Baton, PA-S  Signed, Seen and agree, with changes made. Evelene Croon Barrett, PA-C 03/19/2014 1:01 PM Beeper [phone]

## 2014-03-19 NOTE — Discharge Planning (Signed)
[place] Liaison  Patient states she has care established with [facility] but has no insurance at this time. Appointment was made with pts PCP to obtain the orange card for Friday 03/19/2014 at 1:30pm. Patient was given the orange card application and instructions to take to her appointment. My contact information was provided for any future questions or concerns. No other needs expressed at this time.

## 2014-03-19 NOTE — ED Provider Notes (Signed)
Medical screening examination/treatment/procedure(s) were conducted as a shared visit with non-physician practitioner(s) and myself.  I personally evaluated the patient during the encounter.   EKG Interpretation   Date/Time:  Thursday Mar 19 2014 07:05:56 EDT Ventricular Rate:  86 PR Interval:  156 QRS Duration: 84 QT Interval:  368 QTC Calculation: 440 R Axis:   52 Text Interpretation:  Normal sinus rhythm Non-specific ST-t changes  Confirmed by Wilson Singer  MD, Brinna Divelbiss ([id number]) on 03/19/2014 9:52:9 AM      23 year old female with multiple complaints. Recent symptoms consistent with gastroenteritis. In additional to HA and n/v/d, she's been having intermittent chest pain. Surprisingly her workup with markedly elevated troponin. Marland Kitchen Her chest pain has been intermittent since Monday. Monday she was at work at [employer] when she began having pressure in the center of her chest. She points from the area of her sternal notch down into the epigastrium. First episode associated with nausea and she did vomit. This subsided over the period about an hour. Two more episodes of chest pain since yesterday. Describes a pressure sensation. Associated with an aching in bilateral elbows. No shortness of breath. No diaphoresis. No palpitations. No unusual leg pain or swelling. She is a smoker and uses marijuana. She denies any cocaine or other illicit drug use. No significant past medical history.   Will repeat troponin to verify. Some symptoms typical and some atypical for ACS. Very young. Denies drug use aside from marijuana. Hx of heart problems in grandmother but pt unsure of specifics or age of onset. EKG with nonspecific changes. Doubt PE. Doubt dissection. Pt has has "sinusitits twice" in the past couple months. Consider myocarditis. ASA given. Cardiology consultation if repeat troponin elevated.    Virgel Manifold, MD 03/19/14 [phone]

## 2014-03-20 ENCOUNTER — Ambulatory Visit: Payer: Self-pay

## 2014-03-20 ENCOUNTER — Encounter (HOSPITAL_COMMUNITY)
Admission: EM | Disposition: A | Discharge status: Home / Self Care | Payer: Medicaid Other | Source: Home / Self Care | Attending: Cardiology

## 2014-03-20 DIAGNOSIS — I214 Non-ST elevation (NSTEMI) myocardial infarction: Secondary | ICD-10-CM | POA: Diagnosis present

## 2014-03-20 DIAGNOSIS — R079 Chest pain, unspecified: Secondary | ICD-10-CM

## 2014-03-20 HISTORY — PX: LEFT HEART CATHETERIZATION WITH CORONARY ANGIOGRAM: SHX5451

## 2014-03-20 LAB — BASIC METABOLIC PANEL
BUN: 4 mg/dL — ABNORMAL LOW (ref 6–23)
CALCIUM: 8.5 mg/dL (ref 8.4–10.5)
CO2: 17 mEq/L — ABNORMAL LOW (ref 19–32)
Chloride: 104 mEq/L (ref 96–112)
Creatinine, Ser: 0.42 mg/dL — ABNORMAL LOW (ref 0.50–1.10)
Glucose, Bld: 81 mg/dL (ref 70–99)
POTASSIUM: 5.5 meq/L — AB (ref 3.7–5.3)
Sodium: 135 mEq/L — ABNORMAL LOW (ref 137–147)

## 2014-03-20 LAB — CBC
HCT: 40.6 % (ref 36.0–46.0)
Hemoglobin: 14.1 g/dL (ref 12.0–15.0)
MCH: 31.1 pg (ref 26.0–34.0)
MCHC: 34.7 g/dL (ref 30.0–36.0)
MCV: 89.4 fL (ref 78.0–100.0)
PLATELETS: 250 10*3/uL (ref 150–400)
RBC: 4.54 MIL/uL (ref 3.87–5.11)
RDW: 13.4 % (ref 11.5–15.5)
WBC: 7.5 10*3/uL (ref 4.0–10.5)

## 2014-03-20 LAB — HEPARIN LEVEL (UNFRACTIONATED)
HEPARIN UNFRACTIONATED: 0.12 [IU]/mL — AB (ref 0.30–0.70)
Heparin Unfractionated: 0.19 IU/mL — ABNORMAL LOW (ref 0.30–0.70)

## 2014-03-20 LAB — PROTIME-INR
INR: 1.08 (ref 0.00–1.49)
PROTHROMBIN TIME: 13.8 s (ref 11.6–15.2)

## 2014-03-20 LAB — TROPONIN I: Troponin I: 0.47 ng/mL (ref <0.30)

## 2014-03-20 SURGERY — LEFT HEART CATHETERIZATION WITH CORONARY ANGIOGRAM
Anesthesia: LOCAL

## 2014-03-20 MED ORDER — SODIUM CHLORIDE 0.9 % IJ SOLN: 3.0000 mL | Freq: Two times a day (BID) | INTRAMUSCULAR | Status: DC

## 2014-03-20 MED ORDER — POTASSIUM CHLORIDE CRYS ER 20 MEQ PO TBCR
40.0000 meq | EXTENDED_RELEASE_TABLET | Freq: Once | ORAL | Status: AC
  Administered 2014-03-20: 40 meq via ORAL
  Filled 2014-03-20: qty 2

## 2014-03-20 MED ORDER — SODIUM CHLORIDE 0.9 % IJ SOLN: 3.0000 mL | INTRAMUSCULAR | Status: DC | PRN

## 2014-03-20 MED ORDER — HEPARIN SODIUM (PORCINE) 1000 UNIT/ML IJ SOLN
INTRAMUSCULAR | Status: AC
Start: 2014-03-20 — End: 2014-03-20
  Filled 2014-03-20: qty 1

## 2014-03-20 MED ORDER — NITROGLYCERIN 0.2 MG/ML ON CALL CATH LAB
INTRAVENOUS | Status: AC
  Filled 2014-03-20: qty 1

## 2014-03-20 MED ORDER — MIDAZOLAM HCL 2 MG/2ML IJ SOLN
INTRAMUSCULAR | Status: AC
  Filled 2014-03-20: qty 2

## 2014-03-20 MED ORDER — ASPIRIN 81 MG PO CHEW
81.0000 mg | CHEWABLE_TABLET | ORAL | Status: AC
  Administered 2014-03-20: 81 mg via ORAL

## 2014-03-20 MED ORDER — SODIUM CHLORIDE 0.9 % IV SOLN: INTRAVENOUS | Status: AC

## 2014-03-20 MED ORDER — ASPIRIN 81 MG PO CHEW
CHEWABLE_TABLET | ORAL | Status: AC
  Administered 2014-03-20: 81 mg via ORAL
  Filled 2014-03-20: qty 1

## 2014-03-20 MED ORDER — HEPARIN (PORCINE) IN NACL 100-0.45 UNIT/ML-% IJ SOLN
1100.0000 [IU]/h | INTRAMUSCULAR | Status: DC
  Administered 2014-03-20: 1100 [IU]/h via INTRAVENOUS
  Filled 2014-03-20: qty 250

## 2014-03-20 MED ORDER — HEPARIN BOLUS VIA INFUSION
1500.0000 [IU] | INTRAVENOUS | Status: AC
  Administered 2014-03-20: 1500 [IU] via INTRAVENOUS
  Filled 2014-03-20: qty 1500

## 2014-03-20 MED ORDER — SODIUM CHLORIDE 0.9 % IV SOLN
1.0000 mL/kg/h | INTRAVENOUS | Status: DC
  Administered 2014-03-20: 1 mL/kg/h via INTRAVENOUS

## 2014-03-20 MED ORDER — FENTANYL CITRATE 0.05 MG/ML IJ SOLN
INTRAMUSCULAR | Status: AC
  Filled 2014-03-20: qty 2

## 2014-03-20 MED ORDER — LIDOCAINE HCL (PF) 1 % IJ SOLN
INTRAMUSCULAR | Status: AC
  Filled 2014-03-20: qty 30

## 2014-03-20 MED ORDER — VERAPAMIL HCL 2.5 MG/ML IV SOLN
INTRAVENOUS | Status: AC
Start: 2014-03-20 — End: 2014-03-20
  Filled 2014-03-20: qty 2

## 2014-03-20 MED ORDER — HEPARIN (PORCINE) IN NACL 2-0.9 UNIT/ML-% IJ SOLN
INTRAMUSCULAR | Status: AC
  Filled 2014-03-20: qty 1000

## 2014-03-20 MED ORDER — SODIUM CHLORIDE 0.9 % IV SOLN: 250.0000 mL | INTRAVENOUS | Status: DC | PRN

## 2014-03-20 NOTE — Interval H&P Note (Signed)
History and Physical Interval Note:  03/20/2014 4:24 PM  Selena Peterson  has presented today for cardiac cath with the diagnosis of cp/NSTEMI.  The various methods of treatment have been discussed with the patient and family. After consideration of risks, benefits and other options for treatment, the patient has consented to  Procedure(s): LEFT HEART CATHETERIZATION WITH CORONARY ANGIOGRAM (N/A) as a surgical intervention .  The patient's history has been reviewed, patient examined, no change in status, stable for surgery.  I have reviewed the patient's chart and labs.  Questions were answered to the patient's satisfaction.    Cath Lab Visit (complete for each Cath Lab visit)  Clinical Evaluation Leading to the Procedure:   ACS: yes  Non-ACS:    Anginal Classification: CCS IV  Anti-ischemic medical therapy: No Therapy  Non-Invasive Test Results: No non-invasive testing performed  Prior CABG: No previous CABG        Burnell Blanks

## 2014-03-20 NOTE — H&P (View-Only) (Signed)
Subjective: Still had some CP last night.  Objective: Vital signs in last 24 hours: Temp:  [97.9 F (36.6 C)-98.6 F (37 C)] 98.2 F (36.8 C) (05/29 0802) Pulse Rate:  [43-102] 75 (05/29 0802) Resp:  [13-29] 16 (05/29 0802) BP: (116-154)/(73-99) 121/75 mmHg (05/29 0802) SpO2:  [95 %-100 %] 100 % (05/29 0802) Weight:  [137 lb 4.8 oz (62.279 kg)-138 lb 14.4 oz (63.005 kg)] 137 lb 4.8 oz (62.279 kg) (05/29 0400) Last BM Date: 03/19/14  Intake/Output from previous day:   Intake/Output this shift:    Medications Current Facility-Administered Medications  Medication Dose Route Frequency Provider Last Rate Last Dose  . 0.9 %  sodium chloride infusion   Intravenous Continuous Evelene Croon Barrett, PA-C 50 mL/hr at 03/19/14 1745    . acetaminophen (TYLENOL) tablet 650 mg  650 mg Oral Q4H PRN Rhonda G Barrett, PA-C      . ALPRAZolam Duanne Moron) tablet 0.25 mg  0.25 mg Oral BID PRN Evelene Croon Barrett, PA-C   0.25 mg at 03/20/14 0657  . gi cocktail (Maalox,Lidocaine,Donnatal)  30 mL Oral BID PRN Stephani Police, MD   30 mL at 03/19/14 2210  . heparin ADULT infusion 100 units/mL (25000 units/250 mL)  900 Units/hr Intravenous Continuous Narda Bonds, RPH 9 mL/hr at 03/20/14 0639 900 Units/hr at 03/20/14 0639  . ondansetron (ZOFRAN) injection 4 mg  4 mg Intravenous Q6H PRN Rhonda G Barrett, PA-C      . zolpidem (AMBIEN) tablet 5 mg  5 mg Oral QHS PRN Lonn Georgia, PA-C        PE: General appearance: alert, cooperative and no distress Lungs: clear to auscultation bilaterally Heart: regular rate and rhythm, S1, S2 normal, no murmur, click, rub or gallop Extremities: No LEE Pulses: 2+ and symmetric Skin: Warm and dry Neurologic: Grossly normal  Lab Results:   Recent Labs  03/18/14 0933 03/18/14 1000 03/19/14 0816 03/20/14 0435  WBC 7.7  --  6.9 7.5  HGB 16.7* 19.0* 14.7 14.1  HCT 47.6* 56.0* 41.7 40.6  PLT 313  --  271 250   BMET  Recent Labs  03/18/14 0933 03/18/14 1000  03/19/14 0816  NA 137 138 139  K 3.7 3.5* 3.4*  CL 98 101 102  CO2 22  --  24  GLUCOSE 105* 109* 96  BUN 7 4* 7  CREATININE 0.58 0.60 0.58  CALCIUM 9.8  --  9.1     Assessment/Plan Selena Peterson is a 23 y.o. female smoker with no significant PMH who presented to the ED complaining of chest pain. The chest pain began on Monday morning while she was at work with associated N/V, abdominal pain, and headache. She felt like she "needed to burp but couldn't". The pain was midsternal, "pressure like" and did not radiate. Over the next few days, her CP continued as well as the N/V, abdominal pain, headache as well as diarrhea. She went to urgent care yesterday 03/18/14 and was diagnosed with gastritis.  Principal Problem:   Chest pain Active Problems:   Elevated troponin She was still having some CP last night.  Started at 2.52 and is trending down.  CKMB 13.3 and trending down.  Negative DDimer. Negative CTA chest .  Negative CT Head.  Echo normal according to Dr. Radford Pax' note.  The worst of her pain was two days ago.  My guess is her troponin peaked higher and has been trending down.  Cath for today.  On IV heparin.  Pregnancy test negative.   LOS: 1 day    [name] PA-C 03/20/2014 8:10 AM As above, patient seen and examined. She denies chest pain this morning and no dyspnea. She has ruled in for a non-ST elevation myocardial infarction. Per Dr. Radford Pax her echocardiogram was normal. CTA showed no pulmonary embolus. As outlined previously plan for cardiac catheterization today. The risks and benefits were discussed and she agrees to proceed. [name]

## 2014-03-20 NOTE — Progress Notes (Signed)
ANTICOAGULATION CONSULT NOTE - Follow Up Consult  Pharmacy Consult for Heparin  Indication: chest pain/ACS  No Known Allergies  Patient Measurements: Height: 5' (152.4 cm) Weight: 137 lb 4.8 oz (62.279 kg) IBW/kg (Calculated) : 45.5  Vital Signs: Temp: 98.1 F (36.7 C) (05/29 0400) BP: 127/80 mmHg (05/29 0400) Pulse Rate: 72 (05/29 0400)  Labs:  Recent Labs  03/18/14 0933 03/18/14 1000  03/19/14 0816 03/19/14 0925 03/19/14 1254 03/19/14 1525 03/19/14 2142 03/20/14 0435  HGB 16.7* 19.0*  --  14.7  --   --   --   --  14.1  HCT 47.6* 56.0*  --  41.7  --   --   --   --  40.6  PLT 313  --   --  271  --   --   --   --  250  HEPARINUNFRC  --   --   --   --   --   --   --   --  0.19*  CREATININE 0.58 0.60  --  0.58  --   --   --   --   --   CKTOTAL  --   --   --   --   --  158  --  153  --   CKMB  --   --   --   --   --  13.3*  --  9.3*  --   TROPONINI  --   --   < > 2.52* 2.44*  --  1.21* 1.26* 0.47*  < > = values in this interval not displayed.  Estimated Creatinine Clearance: 89.4 ml/min (by C-G formula based on Cr of 0.58).   Medications:  Heparin 700 units/hr  Assessment: 23 y/o F on heparin for elevated troponin. First HL is 0.19. Other labs as above.   Goal of Therapy:  Heparin level 0.3-0.7 units/ml Monitor platelets by anticoagulation protocol: Yes   Plan:  -Increase heparin to 900 units/hr -1200 HL -Daily CBC/HL -Monitor for bleeding -F/U cardiology plans  Zakariah Dejarnette 03/20/2014,5:50 AM

## 2014-03-20 NOTE — Progress Notes (Signed)
Subjective: Still had some CP last night.  Objective: Vital signs in last 24 hours: Temp:  [97.9 F (36.6 C)-98.6 F (37 C)] 98.2 F (36.8 C) (05/29 0802) Pulse Rate:  [43-102] 75 (05/29 0802) Resp:  [13-29] 16 (05/29 0802) BP: (116-154)/(73-99) 121/75 mmHg (05/29 0802) SpO2:  [95 %-100 %] 100 % (05/29 0802) Weight:  [137 lb 4.8 oz (62.279 kg)-138 lb 14.4 oz (63.005 kg)] 137 lb 4.8 oz (62.279 kg) (05/29 0400) Last BM Date: 03/19/14  Intake/Output from previous day:   Intake/Output this shift:    Medications Current Facility-Administered Medications  Medication Dose Route Frequency Provider Last Rate Last Dose  . 0.9 %  sodium chloride infusion   Intravenous Continuous Evelene Croon Barrett, PA-C 50 mL/hr at 03/19/14 1745    . acetaminophen (TYLENOL) tablet 650 mg  650 mg Oral Q4H PRN Rhonda G Barrett, PA-C      . ALPRAZolam Duanne Moron) tablet 0.25 mg  0.25 mg Oral BID PRN Evelene Croon Barrett, PA-C   0.25 mg at 03/20/14 0657  . gi cocktail (Maalox,Lidocaine,Donnatal)  30 mL Oral BID PRN Stephani Police, MD   30 mL at 03/19/14 2210  . heparin ADULT infusion 100 units/mL (25000 units/250 mL)  900 Units/hr Intravenous Continuous Narda Bonds, RPH 9 mL/hr at 03/20/14 0639 900 Units/hr at 03/20/14 0639  . ondansetron (ZOFRAN) injection 4 mg  4 mg Intravenous Q6H PRN Rhonda G Barrett, PA-C      . zolpidem (AMBIEN) tablet 5 mg  5 mg Oral QHS PRN Lonn Georgia, PA-C        PE: General appearance: alert, cooperative and no distress Lungs: clear to auscultation bilaterally Heart: regular rate and rhythm, S1, S2 normal, no murmur, click, rub or gallop Extremities: No LEE Pulses: 2+ and symmetric Skin: Warm and dry Neurologic: Grossly normal  Lab Results:   Recent Labs  03/18/14 0933 03/18/14 1000 03/19/14 0816 03/20/14 0435  WBC 7.7  --  6.9 7.5  HGB 16.7* 19.0* 14.7 14.1  HCT 47.6* 56.0* 41.7 40.6  PLT 313  --  271 250   BMET  Recent Labs  03/18/14 0933 03/18/14 1000  03/19/14 0816  NA 137 138 139  K 3.7 3.5* 3.4*  CL 98 101 102  CO2 22  --  24  GLUCOSE 105* 109* 96  BUN 7 4* 7  CREATININE 0.58 0.60 0.58  CALCIUM 9.8  --  9.1     Assessment/Plan HEATHER MCKENDREE is a 23 y.o. female smoker with no significant PMH who presented to the ED complaining of chest pain. The chest pain began on Monday morning while she was at work with associated N/V, abdominal pain, and headache. She felt like she "needed to burp but couldn't". The pain was midsternal, "pressure like" and did not radiate. Over the next few days, her CP continued as well as the N/V, abdominal pain, headache as well as diarrhea. She went to urgent care yesterday 03/18/14 and was diagnosed with gastritis.  Principal Problem:   Chest pain Active Problems:   Elevated troponin She was still having some CP last night.  Started at 2.52 and is trending down.  CKMB 13.3 and trending down.  Negative DDimer. Negative CTA chest .  Negative CT Head.  Echo normal according to Dr. Radford Pax' note.  The worst of her pain was two days ago.  My guess is her troponin peaked higher and has been trending down.  Cath for today.  On IV heparin.  Pregnancy test negative.   LOS: 1 day    [name] PA-C 03/20/2014 8:10 AM As above, patient seen and examined. She denies chest pain this morning and no dyspnea. She has ruled in for a non-ST elevation myocardial infarction. Per Dr. Radford Pax her echocardiogram was normal. CTA showed no pulmonary embolus. As outlined previously plan for cardiac catheterization today. The risks and benefits were discussed and she agrees to proceed. [name]

## 2014-03-20 NOTE — CV Procedure (Signed)
      Cardiac Catheterization Operative Report  Selena Peterson [id number] 5/29/20154:59 PM Pcp Not In System  Procedure Performed:  1. Left Heart Catheterization 2. Selective Coronary Angiography 3. Left ventricular angiogram  Operator: Lauree Chandler, MD  Arterial access site:  Right radial artery.   Indication: 23 yo female admitted with chest pain, NSTEMI. Echo with normal LVEF. CTA negative for PE.                                      Procedure Details: The risks, benefits, complications, treatment options, and expected outcomes were discussed with the patient. The patient and/or family concurred with the proposed plan, giving informed consent. The patient was brought to the cath lab after IV hydration was begun and oral premedication was given. The patient was further sedated with Versed and Fentanyl. The right wrist was assessed with an Allens test which was positive. The right wrist was prepped and draped in a sterile fashion. 1% lidocaine was used for local anesthesia. Using the modified Seldinger access technique, a 5 French sheath was placed in the right radial artery. 3 mg Verapamil was given through the sheath. 3500 units IV heparin was given. Standard diagnostic catheters were used to perform selective coronary angiography. A pigtail catheter was used to perform a left ventricular angiogram. The sheath was removed from the right radial artery and a Terumo hemostasis band was applied at the arteriotomy site on the right wrist.   There were no immediate complications. The patient was taken to the recovery area in stable condition.   Hemodynamic Findings: Central aortic pressure: 144/89 Left ventricular pressure: 137/5/10  Angiographic Findings:  Left main: No obstructive disease.   Left Anterior Descending Artery: Large caliber vessel that courses to the apex. Moderate caliber diagonal branch. No obstructive disease. No evidence of dissection.   Circumflex Artery:  Large caliber vessel with large intermediate branch, moderate caliber obtuse marginal branch. No obstructive disease. No evidence of dissection.   Right Coronary Artery: Large dominant vessel. No obstructive disease. No evidence of dissection.   Left Ventricular Angiogram: LVEF=60%.   Impression: 1. No angiographic evidence of CAD 2. Normal LV function 3. Possible coronary vasospasm or myopericarditis  Recommendations: Further management per primary team. No further ischemia evaluation.        Complications:  None. The patient tolerated the procedure well.

## 2014-03-20 NOTE — Progress Notes (Signed)
ANTICOAGULATION CONSULT NOTE - Follow Up Consult  Pharmacy Consult for Heparin  Indication: chest pain/ACS  No Known Allergies  Patient Measurements: Height: 5' (152.4 cm) Weight: 137 lb 4.8 oz (62.279 kg) IBW/kg (Calculated) : 45.5 Heparin Dosing Weight: 58.7 kg   Vital Signs: Temp: 98.2 F (36.8 C) (05/29 0802) Temp src: Oral (05/29 0802) BP: 121/75 mmHg (05/29 0802) Pulse Rate: 75 (05/29 0802)  Labs:  Recent Labs  03/18/14 0933 03/18/14 1000  03/19/14 0816 03/19/14 0925 03/19/14 1254 03/19/14 1525 03/19/14 2142 03/20/14 0435 03/20/14 1045  HGB 16.7* 19.0*  --  14.7  --   --   --   --  14.1  --   HCT 47.6* 56.0*  --  41.7  --   --   --   --  40.6  --   PLT 313  --   --  271  --   --   --   --  250  --   LABPROT  --   --   --   --   --   --   --   --   --  13.8  INR  --   --   --   --   --   --   --   --   --  1.08  HEPARINUNFRC  --   --   --   --   --   --   --   --  0.19* 0.12*  CREATININE 0.58 0.60  --  0.58  --   --   --   --   --   --   CKTOTAL  --   --   --   --   --  158  --  153  --   --   CKMB  --   --   --   --   --  13.3*  --  9.3*  --   --   TROPONINI  --   --   < > 2.52* 2.44*  --  1.21* 1.26* 0.47*  --   < > = values in this interval not displayed.  Estimated Creatinine Clearance: 89.4 ml/min (by C-G formula based on Cr of 0.58).   Medications:  Heparin 900 units/hr  Assessment: 23 y/o F on heparin for elevated troponin. Second heparin level is 0.12 on 900 units/hr IV heparin drip. Heparin level decreased from 0.19 despite heparin infusion rate increased early this AM. CBC stable. No bleeding reported.  Still had some CP last PM. She has ruled in for a non-ST elevation myocardial infarction. Plan for cardiac cath today.    Goal of Therapy:  Heparin level 0.3-0.7 units/ml Monitor platelets by anticoagulation protocol: Yes   Plan:  Bolus IV heparin 1500 units IV x1  Increase heparin drip rate to 1100 units/hr F/u after cath today. -Daily  CBC/HL -Monitor for bleeding -F/U cardiology plans  Nicole Cella, RPh Clinical Pharmacist Pager: [phone] 03/20/2014,11:59 AM

## 2014-03-21 DIAGNOSIS — I214 Non-ST elevation (NSTEMI) myocardial infarction: Secondary | ICD-10-CM

## 2014-03-21 MED ORDER — DILTIAZEM HCL ER COATED BEADS 120 MG PO CP24: 120.0000 mg | ORAL_CAPSULE | Freq: Every day | ORAL | Status: DC

## 2014-03-21 MED ORDER — ASPIRIN EC 81 MG PO TBEC: 81.0000 mg | DELAYED_RELEASE_TABLET | Freq: Every day | ORAL | Status: DC

## 2014-03-21 MED ORDER — DILTIAZEM HCL ER COATED BEADS 120 MG PO CP24
120.0000 mg | ORAL_CAPSULE | Freq: Every day | ORAL | Status: DC
  Administered 2014-03-21: 120 mg via ORAL
  Filled 2014-03-21: qty 1

## 2014-03-21 NOTE — Discharge Instructions (Signed)

## 2014-03-21 NOTE — Progress Notes (Signed)
Patient Name: Selena Peterson Date of Encounter: 03/21/2014     Principal Problem:   NSTEMI (non-ST elevated myocardial infarction) Active Problems:   Chest pain   Elevated troponin    SUBJECTIVE  The patient had very mild discomfort last night, none since.  She is anxious to go home.  CURRENT MEDS    OBJECTIVE  Filed Vitals:   03/20/14 1815 03/20/14 1920 03/20/14 2100 03/21/14 0500  BP: 150/88 132/86 134/75 117/76  Pulse: 88 74 69 69  Temp:   98 F (36.7 C) 98.3 F (36.8 C)  TempSrc:      Resp: 17 17 16 16   Height:      Weight:    137 lb 3.2 oz (62.234 kg)  SpO2: 97% 99% 98% 99%    Intake/Output Summary (Last 24 hours) at 03/21/14 1051 Last data filed at 03/20/14 2100  Gross per 24 hour  Intake    240 ml  Output      0 ml  Net    240 ml   Filed Weights   03/19/14 1725 03/20/14 0400 03/21/14 0500  Weight: 138 lb 14.4 oz (63.005 kg) 137 lb 4.8 oz (62.279 kg) 137 lb 3.2 oz (62.234 kg)    PHYSICAL EXAM  General: Pleasant, NAD. Neuro: Alert and oriented X 3. Moves all extremities spontaneously. Psych: Normal affect. HEENT:  Normal  Neck: Supple without bruits or JVD. Lungs:  Resp regular and unlabored, CTA. Heart: RRR no s3, s4, or murmurs. Abdomen: Soft, non-tender, non-distended, BS + x 4.  Extremities: No clubbing, cyanosis or edema. DP/PT/Radials 2+ and equal bilaterally.  Accessory Clinical Findings  CBC  Recent Labs  03/19/14 0816 03/20/14 0435  WBC 6.9 7.5  NEUTROABS 3.7  --   HGB 14.7 14.1  HCT 41.7 40.6  MCV 88.7 89.4  PLT 271 638   Basic Metabolic Panel  Recent Labs  03/19/14 0816 03/20/14 1155  NA 139 135*  K 3.4* 5.5*  CL 102 104  CO2 24 17*  GLUCOSE 96 81  BUN 7 4*  CREATININE 0.58 0.42*  CALCIUM 9.1 8.5   Liver Function Tests  Recent Labs  03/19/14 0816  AST 25  ALT 16  ALKPHOS 61  BILITOT <0.2*  PROT 7.7  ALBUMIN 3.7    Recent Labs  03/19/14 0816  LIPASE 15   Cardiac Enzymes  Recent Labs  03/19/14 0925 03/19/14 1254 03/19/14 1525 03/19/14 2142 03/20/14 0435  CKTOTAL  --  158  --  153  --   CKMB  --  13.3*  --  9.3*  --   TROPONINI 2.44*  --  1.21* 1.26* 0.47*   BNP No components found with this basename: POCBNP,  D-Dimer  Recent Labs  03/19/14 1525  DDIMER <0.27   Hemoglobin A1C No results found for this basename: HGBA1C,  in the last 72 hours Fasting Lipid Panel No results found for this basename: CHOL, HDL, LDLCALC, TRIG, CHOLHDL, LDLDIRECT,  in the last 72 hours Thyroid Function Tests No results found for this basename: TSH, T4TOTAL, FREET3, T3FREE, THYROIDAB,  in the last 72 hours  TELE  Sinus tachycardia  ECG  Repeat EKG pending  Radiology/Studies  Dg Chest 2 View  03/19/2014   CLINICAL DATA:  Chest pain and fever.  EXAM: CHEST - 2 VIEW  COMPARISON:  None  FINDINGS: The heart size and mediastinal contours are within normal limits. There is no evidence of pulmonary edema, consolidation, pneumothorax, nodule or pleural fluid. The visualized  skeletal structures are unremarkable.  IMPRESSION: No active disease.   Electronically Signed   By: Aletta Edouard M.D.   On: 03/19/2014 08:38   Ct Head Wo Contrast  03/19/2014   CLINICAL DATA:  Headache, abnormal troponins  EXAM: CT HEAD WITHOUT CONTRAST  TECHNIQUE: Contiguous axial images were obtained from the base of the skull through the vertex without intravenous contrast.  COMPARISON:  None.  FINDINGS: Negative for acute intracranial hemorrhage, acute infarction, mass, mass effect, hydrocephalus or midline shift. Gray-white differentiation is preserved throughout. No acute soft tissue or calvarial abnormality. The globes and orbits are symmetric and unremarkable. Normal aeration of the mastoid air cells and visualized paranasal sinuses.  IMPRESSION: Negative head CT.   Electronically Signed   By: Jacqulynn Cadet M.D.   On: 03/19/2014 16:30   Ct Angio Chest Pe W/cm &/or Wo Cm  03/19/2014   CLINICAL DATA:   23 year old female with headache, chest pain. Initial encounter.  EXAM: CT ANGIOGRAPHY CHEST WITH CONTRAST  TECHNIQUE: Multidetector CT imaging of the chest was performed using the standard protocol during bolus administration of intravenous contrast. Multiplanar CT image reconstructions and MIPs were obtained to evaluate the vascular anatomy.  CONTRAST:  169mL OMNIPAQUE IOHEXOL 350 MG/ML SOLN  COMPARISON:  Chest radiographs 0803 hr the same day.  FINDINGS: Good contrast bolus timing in the pulmonary arterial tree. Motion artifact at the lung bases. No focal filling defect identified in the pulmonary arterial tree to suggest the presence of acute pulmonary embolism.  Major airways are patent. The lungs are clear except for minimal dependent atelectasis. No pericardial or pleural effusion. Negative thoracic inlet. Visualized aorta is normal. No mediastinal or axillary lymphadenopathy. Negative visualized upper abdominal viscera.  No acute osseous abnormality identified.  Review of the MIP images confirms the above findings.  IMPRESSION: No evidence of acute pulmonary embolus.  Negative chest CTA.   Electronically Signed   By: Lars Pinks M.D.   On: 03/19/2014 16:31    ASSESSMENT AND PLAN  1. No angiographic evidence of CAD  2. Normal LV function  3. Possible coronary vasospasm or myopericarditis.  Two-dimensional echocardiogram normal. 4. past history of mild hypertension not currently under treatment  Plan: We will start her on calcium channel blocker diltiazem CD 120 mg daily We will repeat her EKG this morning She should be able to go home later today with followup in the office with Dr. Golden Hurter.  Continue baby aspirin daily  Signed, Darlin Coco MD

## 2014-03-21 NOTE — Discharge Summary (Signed)
Discharge Summary   Patient ID: Selena Peterson MRN: [id number], DOB/AGE: [birth year] 23 y.o. Admit date: 03/19/2014 D/C date:     03/21/2014  Primary Cardiologist: Dr. Radford Pax  Principal Problem:   NSTEMI (non-ST elevated myocardial infarction) Active Problems:   TOBACCO USER   Chest pain   Elevated troponin   Admission Dates: 03/19/14-03/21/14 Discharge Diagnosis: Chest pain associated with elevated troponin of unclear eitology  HPI: Selena Peterson is a 23 y.o. female smoker with no significant PMH who presented to the ED complaining of chest pain and her troponin was noted to be elevated.  The patient reported chest pain off and on for years with recent chest pressure and burning and felt to be due to gastritis and was associated with abdominal discomfort. She is an occasional smoker but has no family history of CAD and no PMH for herself.  Hospital Course: Upon admission her troponin noted to be 2.52 but her ECG was non acute. Her CPK was normal and CK-MB was mildly elevated. A chest CT anigo was ordered to rule out PE, which returned normal. A 2D echo and head CT were also normal. A urine pregnancy test was negative and a urine drug screen returned positive for THC and barbiturates. With chest pain in the setting of elevated troponin she was placed on heparin and recommended for cardiac cath. She underwent cardiac cath on 03/10/14 which revealed no angiographic evidence of CAD and normal LV function. The etiology of her chest pain and elevated cardiac enzymes were thought to possibly be due to coronary vasospasm or myopericarditis. She was started on diltiazem CD 120 mg daily for possible vasospasm and a daily aspirin.   The patient has had an uncomplicated hospital course and is recovering well. The radial catheter site is stable. She has been seen by Dr. Mare Ferrari today and deemed ready for discharge home. All follow-up appointments have been scheduled. Smoking cessation was disscussed in  length. Discharge medications include ASA and diltiazem CD 120 daily.  Discharge Vitals: Blood pressure 120/70, pulse 69, temperature 98.3 F (36.8 C), temperature source Oral, resp. rate 16, height 5' (1.524 m), weight 137 lb 3.2 oz (62.234 kg), last menstrual period 03/01/2014, SpO2 99.00%.  Labs: Lab Results  Component Value Date   WBC 7.5 03/20/2014   HGB 14.1 03/20/2014   HCT 40.6 03/20/2014   MCV 89.4 03/20/2014   PLT 250 03/20/2014     Recent Labs Lab 03/19/14 0816 03/20/14 1155  NA 139 135*  K 3.4* 5.5*  CL 102 104  CO2 24 17*  BUN 7 4*  CREATININE 0.58 0.42*  CALCIUM 9.1 8.5  PROT 7.7  --   BILITOT <0.2*  --   ALKPHOS 61  --   ALT 16  --   AST 25  --   GLUCOSE 96 81    Recent Labs  03/19/14 0925 03/19/14 1254 03/19/14 1525 03/19/14 2142 03/20/14 0435  CKTOTAL  --  158  --  153  --   CKMB  --  13.3*  --  9.3*  --   TROPONINI 2.44*  --  1.21* 1.26* 0.47*    Lab Results  Component Value Date   DDIMER <0.27 03/19/2014    Diagnostic Studies/Procedures   Dg Chest 2 View  03/19/2014   CLINICAL DATA:  Chest pain and fever.  EXAM: CHEST - 2 VIEW  COMPARISON:  None  FINDINGS: The heart size and mediastinal contours are within normal limits. There is no evidence  of pulmonary edema, consolidation, pneumothorax, nodule or pleural fluid. The visualized skeletal structures are unremarkable.  IMPRESSION: No active disease.     Ct Head Wo Contrast  03/19/2014   CLINICAL DATA:  Headache, abnormal troponins  EXAM: CT HEAD WITHOUT CONTRAST  TECHNIQUE: Contiguous axial images were obtained from the base of the skull through the vertex without intravenous contrast.  COMPARISON:  None.  FINDINGS: Negative for acute intracranial hemorrhage, acute infarction, mass, mass effect, hydrocephalus or midline shift. Gray-white differentiation is preserved throughout. No acute soft tissue or calvarial abnormality. The globes and orbits are symmetric and unremarkable. Normal aeration of the  mastoid air cells and visualized paranasal sinuses.  IMPRESSION: Negative head CT.   Ct Angio Chest Pe W/cm &/or Wo Cm  03/19/2014   CLINICAL DATA:  23 year old female with headache, chest pain. Initial encounter.  EXAM: CT ANGIOGRAPHY CHEST WITH CONTRAST  TECHNIQUE: Multidetector CT imaging of the chest was performed using the standard protocol during bolus administration of intravenous contrast. Multiplanar CT image reconstructions and MIPs were obtained to evaluate the vascular anatomy.  CONTRAST:  163mL OMNIPAQUE IOHEXOL 350 MG/ML SOLN  COMPARISON:  Chest radiographs 0803 hr the same day.  FINDINGS: Good contrast bolus timing in the pulmonary arterial tree. Motion artifact at the lung bases. No focal filling defect identified in the pulmonary arterial tree to suggest the presence of acute pulmonary embolism.  Major airways are patent. The lungs are clear except for minimal dependent atelectasis. No pericardial or pleural effusion. Negative thoracic inlet. Visualized aorta is normal. No mediastinal or axillary lymphadenopathy. Negative visualized upper abdominal viscera.  No acute osseous abnormality identified.  Review of the MIP images confirms the above findings.  IMPRESSION: No evidence of acute pulmonary embolus.  Negative chest CTA.      Cardiac Catheterization Operative Report  Selena Peterson  [id number]  5/29/20154:59 PM  Pcp Not In System  Procedure Performed:  1. Left Heart Catheterization 2. Selective Coronary Angiography 3. Left ventricular angiogram Operator: Lauree Chandler, MD  Arterial access site: Right radial artery.  Indication: 23 yo female admitted with chest pain, NSTEMI. Echo with normal LVEF. CTA negative for PE.  Procedure Details:  The risks, benefits, complications, treatment options, and expected outcomes were discussed with the patient. The patient and/or family concurred with the proposed plan, giving informed consent. The patient was brought to the cath lab  after IV hydration was begun and oral premedication was given. The patient was further sedated with Versed and Fentanyl. The right wrist was assessed with an Allens test which was positive. The right wrist was prepped and draped in a sterile fashion. 1% lidocaine was used for local anesthesia. Using the modified Seldinger access technique, a 5 French sheath was placed in the right radial artery. 3 mg Verapamil was given through the sheath. 3500 units IV heparin was given. Standard diagnostic catheters were used to perform selective coronary angiography. A pigtail catheter was used to perform a left ventricular angiogram. The sheath was removed from the right radial artery and a Terumo hemostasis band was applied at the arteriotomy site on the right wrist.  There were no immediate complications. The patient was taken to the recovery area in stable condition.  Hemodynamic Findings:  Central aortic pressure: 144/89  Left ventricular pressure: 137/5/10  Angiographic Findings:  Left main: No obstructive disease.  Left Anterior Descending Artery: Large caliber vessel that courses to the apex. Moderate caliber diagonal branch. No obstructive disease. No evidence of dissection.  Circumflex  Artery: Large caliber vessel with large intermediate branch, moderate caliber obtuse marginal branch. No obstructive disease. No evidence of dissection.  Right Coronary Artery: Large dominant vessel. No obstructive disease. No evidence of dissection.  Left Ventricular Angiogram: LVEF=60%.  Impression:  1. No angiographic evidence of CAD  2. Normal LV function  3. Possible coronary vasospasm or myopericarditis  Recommendations: Further management per primary team. No further ischemia evaluation.  Complications: None. The patient tolerated the procedure well.      Discharge Medications     Medication List         acetaminophen 500 MG tablet  Commonly known as:  TYLENOL  Take 500 mg by mouth every 6 (six) hours  as needed for moderate pain.     aspirin EC 81 MG tablet  Take 1 tablet (81 mg total) by mouth daily.     diltiazem 120 MG 24 hr capsule  Commonly known as:  CARDIZEM CD  Take 1 capsule (120 mg total) by mouth daily.        Disposition   The patient will be discharged in stable condition to home.  Follow-up Information   Follow up with Sueanne Margarita, MD. (The office will call you to make an appointment for 1-2 weeks. If you do not hear from the office on monday please call and make an appointment. )    Specialty:  Cardiology   Contact information:   [address] [place] [address] [phone]         Duration of Discharge Encounter: Greater than 30 minutes including physician and PA time.  Signed, Perry Mount PA-C 03/21/2014, 2:33 PM

## 2014-03-23 ENCOUNTER — Emergency Department (HOSPITAL_COMMUNITY)
Admission: EM | Admit: 2014-03-23 | Discharge: 2014-03-23 | Disposition: A | Discharge status: Home / Self Care | Payer: Medicaid Other | Attending: Emergency Medicine | Admitting: Emergency Medicine

## 2014-03-23 ENCOUNTER — Emergency Department (HOSPITAL_COMMUNITY): Payer: Medicaid Other

## 2014-03-23 ENCOUNTER — Encounter (HOSPITAL_COMMUNITY): Payer: Self-pay | Admitting: Emergency Medicine

## 2014-03-23 DIAGNOSIS — F419 Anxiety disorder, unspecified: Secondary | ICD-10-CM

## 2014-03-23 DIAGNOSIS — F17200 Nicotine dependence, unspecified, uncomplicated: Secondary | ICD-10-CM | POA: Insufficient documentation

## 2014-03-23 DIAGNOSIS — Z8781 Personal history of (healed) traumatic fracture: Secondary | ICD-10-CM | POA: Insufficient documentation

## 2014-03-23 DIAGNOSIS — Z8742 Personal history of other diseases of the female genital tract: Secondary | ICD-10-CM | POA: Insufficient documentation

## 2014-03-23 DIAGNOSIS — R079 Chest pain, unspecified: Secondary | ICD-10-CM | POA: Insufficient documentation

## 2014-03-23 DIAGNOSIS — Z8619 Personal history of other infectious and parasitic diseases: Secondary | ICD-10-CM | POA: Insufficient documentation

## 2014-03-23 DIAGNOSIS — F411 Generalized anxiety disorder: Secondary | ICD-10-CM | POA: Insufficient documentation

## 2014-03-23 DIAGNOSIS — Z79899 Other long term (current) drug therapy: Secondary | ICD-10-CM | POA: Insufficient documentation

## 2014-03-23 DIAGNOSIS — Z7982 Long term (current) use of aspirin: Secondary | ICD-10-CM | POA: Insufficient documentation

## 2014-03-23 LAB — I-STAT TROPONIN, ED
Troponin i, poc: 0 ng/mL (ref 0.00–0.08)
Troponin i, poc: 0.01 ng/mL (ref 0.00–0.08)

## 2014-03-23 LAB — CBC
HCT: 43.8 % (ref 36.0–46.0)
Hemoglobin: 15.3 g/dL — ABNORMAL HIGH (ref 12.0–15.0)
MCH: 31 pg (ref 26.0–34.0)
MCHC: 34.9 g/dL (ref 30.0–36.0)
MCV: 88.7 fL (ref 78.0–100.0)
PLATELETS: 332 10*3/uL (ref 150–400)
RBC: 4.94 MIL/uL (ref 3.87–5.11)
RDW: 13.3 % (ref 11.5–15.5)
WBC: 8.9 10*3/uL (ref 4.0–10.5)

## 2014-03-23 LAB — BASIC METABOLIC PANEL
BUN: 6 mg/dL (ref 6–23)
CALCIUM: 9.6 mg/dL (ref 8.4–10.5)
CO2: 22 mEq/L (ref 19–32)
CREATININE: 0.47 mg/dL — AB (ref 0.50–1.10)
Chloride: 102 mEq/L (ref 96–112)
GFR calc non Af Amer: >90 mL/min (ref >90)
Glucose, Bld: 88 mg/dL (ref 70–99)
Potassium: 4.3 mEq/L (ref 3.7–5.3)
Sodium: 138 mEq/L (ref 137–147)

## 2014-03-23 MED ORDER — CLONAZEPAM 0.5 MG PO TABS: 0.5000 mg | ORAL_TABLET | Freq: Two times a day (BID) | ORAL | Status: DC | PRN

## 2014-03-23 NOTE — ED Provider Notes (Signed)
CSN: [id number]     Arrival date & time 03/23/14  1045 History   First MD Initiated Contact with Patient 03/23/14 1108     Chief Complaint  Patient presents with  . Chest Pain      HPI Patient recently admitted with troponin pos. Chest pain.  Had negative cath.  Thought pain was from vasospasm  Patient had some pain, but thinks it may be anxiety.  Feels different this time.  Denies XOB or vomiting.  Pain gone now.  Denies cocaine abuse. Past Medical History  Diagnosis Date  . Chlamydia   . BV (bacterial vaginosis)    Past Surgical History  Procedure Laterality Date  . Ankle fracture surgery     Family History  Problem Relation Age of Onset  . Diabetes Maternal Grandmother   . Hypertension Maternal Grandmother   . GER disease Mother   . Heart failure Maternal Grandmother    History  Substance Use Topics  . Smoking status: Current Every Day Smoker -- 0.30 packs/day for 4 years    Types: Cigarettes  . Smokeless tobacco: Not on file  . Alcohol Use: Yes     Comment: Drinks 1 pint of liquor per week   OB History   Grav Para Term Preterm Abortions TAB SAB Ect Mult Living                 Review of Systems  All other systems reviewed and are negative.     Allergies  Review of patient's allergies indicates no known allergies.  Home Medications   Prior to Admission medications   Medication Sig Start Date End Date Taking? Authorizing Provider  acetaminophen (TYLENOL) 500 MG tablet Take 500 mg by mouth every 6 (six) hours as needed for moderate pain.   Yes Historical Provider, MD  aspirin EC 81 MG tablet Take 1 tablet (81 mg total) by mouth daily. 03/21/14  Yes Perry Mount, PA-C  diltiazem (CARDIZEM CD) 120 MG 24 hr capsule Take 1 capsule (120 mg total) by mouth daily. 03/21/14  Yes Perry Mount, PA-C  clonazePAM (KLONOPIN) 0.5 MG tablet Take 1 tablet (0.5 mg total) by mouth 2 (two) times daily as needed for anxiety. 03/23/14   Dot Lanes, MD   BP 129/88  Pulse 84   Temp(Src) 98.6 F (37 C) (Oral)  Resp 12  SpO2 100%  LMP 03/01/2014 Physical Exam  Nursing note and vitals reviewed. Constitutional: She is oriented to person, place, and time. She appears well-developed and well-nourished. No distress.  HENT:  Head: Normocephalic and atraumatic.  Eyes: Pupils are equal, round, and reactive to light.  Neck: Normal range of motion.  Cardiovascular: Normal rate and intact distal pulses.   Pulmonary/Chest: No respiratory distress.    Pain reproduces with palpation.  This is different then the last time according to patient.  Abdominal: Normal appearance. She exhibits no distension.  Musculoskeletal: Normal range of motion.  Neurological: She is alert and oriented to person, place, and time. No cranial nerve deficit.  Skin: Skin is warm and dry. No rash noted.  Psychiatric: She has a normal mood and affect. Her behavior is normal.    ED Course  Procedures (including critical care time) Labs Review Labs Reviewed  CBC - Abnormal; Notable for the following:    Hemoglobin 15.3 (*)    All other components within normal limits  BASIC METABOLIC PANEL - Abnormal; Notable for the following:    Creatinine, Ser 0.47 (*)    All  other components within normal limits  I-STAT TROPOININ, ED  I-STAT TROPOININ, ED    Imaging Review DG Chest 2 View (Final result)  Result time: 03/23/14 11:31:57    Final result by Rad Results In Interface (03/23/14 11:31:57)    Narrative:   CLINICAL DATA: Chest pain  EXAM: CHEST 2 VIEW  COMPARISON: Chest radiograph and chest CT Mar 19, 2014  FINDINGS: Lungs are clear. Heart size and pulmonary vascularity are normal. No pneumothorax. No adenopathy. No bone lesions.  IMPRESSION: No abnormality noted      EKG Interpretation   Date/Time:  Monday March 23 2014 10:49:35 EDT Ventricular Rate:  91 PR Interval:  150 QRS Duration: 76 QT Interval:  352 QTC Calculation: 432 R Axis:   91 Text Interpretation:  Normal  sinus rhythm with sinus arrhythmia Rightward  axis Borderline ECG No significant change since last tracing Confirmed by  Makaveli Hoard  MD, Hoover Grewe ([id number]) on 03/23/2014 11:12:23 AM       EKG unchanged.  Two negative troponins.  Will treat for anxiety and have patient return if pain returns.   After treatment in the ED the patient feels back to baseline and wants to go home. MDM   Final diagnoses:  Chest pain  Anxiety        Dot Lanes, MD 03/25/14 2055

## 2014-03-23 NOTE — Discharge Planning (Signed)
[place] to patient about follow up care. Pt was admitted and missed the appointment that was made 03/19/14 to obtain the orange card with her PCP. Pt expressed concerns about anxiety, resources given. My contact information was provided for any future questions or concerns.

## 2014-03-23 NOTE — ED Notes (Addendum)
Pt here for severe sudden onset of chest pain that started PTA. sts recently admitted to the hospital for vasospasms and MI. Pt sts she feels anxious.

## 2014-03-23 NOTE — ED Notes (Signed)
Pt states that she thinks today her cp was coming from anxiety, states "with all the stress of thinking about my heart condition and Ive had panic attacks before with the same type of cp".

## 2014-03-23 NOTE — Discharge Instructions (Signed)
Chest Pain (Nonspecific) °It is often hard to give a specific diagnosis for the cause of chest pain. There is always a chance that your pain could be related to something serious, such as a heart attack or a blood clot in the lungs. You need to follow up with your caregiver for further evaluation. °CAUSES  °· Heartburn. °· Pneumonia or bronchitis. °· Anxiety or stress. °· Inflammation around your heart (pericarditis) or lung (pleuritis or pleurisy). °· A blood clot in the lung. °· A collapsed lung (pneumothorax). It can develop suddenly on its own (spontaneous pneumothorax) or from injury (trauma) to the chest. °· Shingles infection (herpes zoster virus). °The chest wall is composed of bones, muscles, and cartilage. Any of these can be the source of the pain. °· The bones can be bruised by injury. °· The muscles or cartilage can be strained by coughing or overwork. °· The cartilage can be affected by inflammation and become sore (costochondritis). °DIAGNOSIS  °Lab tests or other studies, such as X-rays, electrocardiography, stress testing, or cardiac imaging, may be needed to find the cause of your pain.  °TREATMENT  °· Treatment depends on what may be causing your chest pain. Treatment may include: °· Acid blockers for heartburn. °· Anti-inflammatory medicine. °· Pain medicine for inflammatory conditions. °· Antibiotics if an infection is present. °· You may be advised to change lifestyle habits. This includes stopping smoking and avoiding alcohol, caffeine, and chocolate. °· You may be advised to keep your head raised (elevated) when sleeping. This reduces the chance of acid going backward from your stomach into your esophagus. °· Most of the time, nonspecific chest pain will improve within 2 to 3 days with rest and mild pain medicine. °HOME CARE INSTRUCTIONS  °· If antibiotics were prescribed, take your antibiotics as directed. Finish them even if you start to feel better. °· For the next few days, avoid physical  activities that bring on chest pain. Continue physical activities as directed. °· Do not smoke. °· Avoid drinking alcohol. °· Only take over-the-counter or prescription medicine for pain, discomfort, or fever as directed by your caregiver. °· Follow your caregiver's suggestions for further testing if your chest pain does not go away. °· Keep any follow-up appointments you made. If you do not go to an appointment, you could develop lasting (chronic) problems with pain. If there is any problem keeping an appointment, you must call to reschedule. °SEEK MEDICAL CARE IF:  °· You think you are having problems from the medicine you are taking. Read your medicine instructions carefully. °· Your chest pain does not go away, even after treatment. °· You develop a rash with blisters on your chest. °SEEK IMMEDIATE MEDICAL CARE IF:  °· You have increased chest pain or pain that spreads to your arm, neck, jaw, back, or abdomen. °· You develop shortness of breath, an increasing cough, or you are coughing up blood. °· You have severe back or abdominal pain, feel nauseous, or vomit. °· You develop severe weakness, fainting, or chills. °· You have a fever. °THIS IS AN EMERGENCY. Do not wait to see if the pain will go away. Get medical help at once. Call your local emergency services (911 in [place] Do not drive yourself to the hospital. °MAKE SURE YOU:  °· Understand these instructions. °· Will watch your condition. °· Will get help right away if you are not doing well or get worse. °Document Released: 07/19/2005 Document Revised: 01/01/2012 Document Reviewed: 05/14/2008 °[employer]® Patient Information ©2014 [employer]   [employer]. ° °

## 2014-03-25 ENCOUNTER — Ambulatory Visit: Payer: No Typology Code available for payment source

## 2014-03-26 NOTE — ED Provider Notes (Signed)
Medical screening examination/treatment/procedure(s) were performed by non-physician practitioner and as supervising physician I was immediately available for consultation/collaboration.   EKG Interpretation   Date/Time:  Thursday Mar 19 2014 07:05:56 EDT Ventricular Rate:  86 PR Interval:  156 QRS Duration: 84 QT Interval:  368 QTC Calculation: 440 R Axis:   52 Text Interpretation:  Normal sinus rhythm Non-specific ST-t changes  Confirmed by Wilson Singer  MD, Rosbel Buckner ([id number]) on 03/19/2014 9:52:40 AM       Virgel Manifold, MD 03/26/14 [phone]

## 2014-03-27 ENCOUNTER — Encounter (HOSPITAL_COMMUNITY): Payer: Self-pay | Admitting: Emergency Medicine

## 2014-03-27 ENCOUNTER — Emergency Department (HOSPITAL_COMMUNITY): Payer: No Typology Code available for payment source

## 2014-03-27 ENCOUNTER — Other Ambulatory Visit: Payer: Self-pay

## 2014-03-27 ENCOUNTER — Emergency Department (HOSPITAL_COMMUNITY)
Admission: EM | Admit: 2014-03-27 | Discharge: 2014-03-27 | Disposition: A | Discharge status: Home / Self Care | Payer: No Typology Code available for payment source | Attending: Emergency Medicine | Admitting: Emergency Medicine

## 2014-03-27 ENCOUNTER — Telehealth: Payer: Self-pay | Admitting: Cardiology

## 2014-03-27 DIAGNOSIS — R079 Chest pain, unspecified: Secondary | ICD-10-CM

## 2014-03-27 DIAGNOSIS — Z8742 Personal history of other diseases of the female genital tract: Secondary | ICD-10-CM | POA: Insufficient documentation

## 2014-03-27 DIAGNOSIS — R209 Unspecified disturbances of skin sensation: Secondary | ICD-10-CM | POA: Insufficient documentation

## 2014-03-27 DIAGNOSIS — Z79899 Other long term (current) drug therapy: Secondary | ICD-10-CM | POA: Insufficient documentation

## 2014-03-27 DIAGNOSIS — Z3202 Encounter for pregnancy test, result negative: Secondary | ICD-10-CM | POA: Insufficient documentation

## 2014-03-27 DIAGNOSIS — Z8619 Personal history of other infectious and parasitic diseases: Secondary | ICD-10-CM | POA: Insufficient documentation

## 2014-03-27 DIAGNOSIS — R0602 Shortness of breath: Secondary | ICD-10-CM | POA: Insufficient documentation

## 2014-03-27 DIAGNOSIS — F411 Generalized anxiety disorder: Secondary | ICD-10-CM | POA: Insufficient documentation

## 2014-03-27 DIAGNOSIS — R61 Generalized hyperhidrosis: Secondary | ICD-10-CM | POA: Insufficient documentation

## 2014-03-27 DIAGNOSIS — R0789 Other chest pain: Secondary | ICD-10-CM | POA: Insufficient documentation

## 2014-03-27 DIAGNOSIS — F17200 Nicotine dependence, unspecified, uncomplicated: Secondary | ICD-10-CM | POA: Insufficient documentation

## 2014-03-27 HISTORY — DX: Anxiety disorder, unspecified: F41.9

## 2014-03-27 LAB — CBC WITH DIFFERENTIAL/PLATELET
BASOS ABS: 0.1 10*3/uL (ref 0.0–0.1)
BASOS PCT: 1 % (ref 0–1)
EOS ABS: 0.1 10*3/uL (ref 0.0–0.7)
EOS PCT: 1 % (ref 0–5)
HEMATOCRIT: 43.5 % (ref 36.0–46.0)
Hemoglobin: 14.8 g/dL (ref 12.0–15.0)
Lymphocytes Relative: 30 % (ref 12–46)
Lymphs Abs: 3.4 10*3/uL (ref 0.7–4.0)
MCH: 30.6 pg (ref 26.0–34.0)
MCHC: 34 g/dL (ref 30.0–36.0)
MCV: 89.9 fL (ref 78.0–100.0)
MONO ABS: 0.9 10*3/uL (ref 0.1–1.0)
Monocytes Relative: 8 % (ref 3–12)
Neutro Abs: 6.9 10*3/uL (ref 1.7–7.7)
Neutrophils Relative %: 60 % (ref 43–77)
PLATELETS: 363 10*3/uL (ref 150–400)
RBC: 4.84 MIL/uL (ref 3.87–5.11)
RDW: 13.7 % (ref 11.5–15.5)
WBC: 11.4 10*3/uL — ABNORMAL HIGH (ref 4.0–10.5)

## 2014-03-27 LAB — COMPREHENSIVE METABOLIC PANEL
ALT: 18 U/L (ref 0–35)
AST: 14 U/L (ref 0–37)
Albumin: 4.2 g/dL (ref 3.5–5.2)
Alkaline Phosphatase: 65 U/L (ref 39–117)
BILIRUBIN TOTAL: 0.3 mg/dL (ref 0.3–1.2)
BUN: 7 mg/dL (ref 6–23)
CALCIUM: 9.4 mg/dL (ref 8.4–10.5)
CO2: 21 meq/L (ref 19–32)
CREATININE: 0.4 mg/dL — AB (ref 0.50–1.10)
Chloride: 102 mEq/L (ref 96–112)
GFR calc Af Amer: >90 mL/min (ref >90)
GFR calc non Af Amer: >90 mL/min (ref >90)
Glucose, Bld: 87 mg/dL (ref 70–99)
Potassium: 4.1 mEq/L (ref 3.7–5.3)
Sodium: 138 mEq/L (ref 137–147)
TOTAL PROTEIN: 8.2 g/dL (ref 6.0–8.3)

## 2014-03-27 LAB — TROPONIN I

## 2014-03-27 LAB — POC URINE PREG, ED: Preg Test, Ur: NEGATIVE

## 2014-03-27 MED ORDER — ASPIRIN 81 MG PO CHEW
81.0000 mg | CHEWABLE_TABLET | Freq: Once | ORAL | Status: AC
  Administered 2014-03-27: 81 mg via ORAL
  Filled 2014-03-27: qty 1

## 2014-03-27 MED ORDER — ASPIRIN 81 MG PO CHEW: 324.0000 mg | CHEWABLE_TABLET | Freq: Once | ORAL | Status: DC

## 2014-03-27 MED ORDER — ACETAMINOPHEN 325 MG PO TABS
650.0000 mg | ORAL_TABLET | Freq: Once | ORAL | Status: AC
  Administered 2014-03-27: 650 mg via ORAL
  Filled 2014-03-27: qty 2

## 2014-03-27 NOTE — ED Notes (Signed)
Pt called out and c/o chest pain with headache and "warm feeling all over". Pt HR currently 127, ST on heart monitor. EKG done by this RN and handed to Dr. Eulis Foster.

## 2014-03-27 NOTE — ED Provider Notes (Signed)
  Face-to-face evaluation   History: She presents for evaluation of left wrist pain that has been persistent for one week since her right radial artery catheterization, for cardiac evaluation. She also has tingling sensation in her right thumb. Since arriving here, as a 14:50, she has had to 1 minute episodes of chest pain. She relates them to being present when she has a heart rate over 100. She does not have persistent tachycardia in the emergency department. She has not started taking aspirin. Yet, as directed at hospital discharge.  Physical exam: Alert, calm, cooperative. Right arm- site of radial artery catheterization is evident, as a small scab. There is no associated swelling, palpable aneurysm, or pain with movement of the forearm, wrist or hand. She is neurovascular intact distally in the fingers.  Assessment: Resolving post catheterization, discomfort, right arm, without evidence for infection, aneurysm, or significant nerve injury. Nonspecific transient chest pain is unlikely to represent any acute coronary process. The patient's cardiac catheterization was normal. She has been treated for coronary spasm with a calcium channel blocker.  Medical screening examination/treatment/procedure(s) were conducted as a shared visit with non-physician practitioner(s) and myself.  I personally evaluated the patient during the encounter  Richarda Blade, MD 03/29/14 [phone]

## 2014-03-27 NOTE — Discharge Instructions (Signed)
Please call your doctor for a followup appointment within 24-48 hours. When you talk to your doctor please let them know that you were seen in the emergency department and have them acquire all of your records so that they can discuss the findings with you and formulate a treatment plan to fully care for your new and ongoing problems. Please call and set-up an appointment with your primary care provider.  Please call Cardiology and see them by the end of this week Please rest and stay hydrated Please avoid any physical or strenuous activity Please continue to take medications as prescribed Please continue to monitor symptoms and if symptoms are to worsen or change (fever greater than 101, chills, sweating, nausea, vomiting, chest pain, shortness of breath, difficulty breathing, numbness, tingling, worsening or changes to pain pattern, swelling to the arm, red streaks running up the arm) please report back to the ED immediately   Chest Pain (Nonspecific) It is often hard to give a specific diagnosis for the cause of chest pain. There is always a chance that your pain could be related to something serious, such as a heart attack or a blood clot in the lungs. You need to follow up with your caregiver for further evaluation. CAUSES   Heartburn.  Pneumonia or bronchitis.  Anxiety or stress.  Inflammation around your heart (pericarditis) or lung (pleuritis or pleurisy).  A blood clot in the lung.  A collapsed lung (pneumothorax). It can develop suddenly on its own (spontaneous pneumothorax) or from injury (trauma) to the chest.  Shingles infection (herpes zoster virus). The chest wall is composed of bones, muscles, and cartilage. Any of these can be the source of the pain.  The bones can be bruised by injury.  The muscles or cartilage can be strained by coughing or overwork.  The cartilage can be affected by inflammation and become sore (costochondritis). DIAGNOSIS  Lab tests or other  studies, such as X-rays, electrocardiography, stress testing, or cardiac imaging, may be needed to find the cause of your pain.  TREATMENT   Treatment depends on what may be causing your chest pain. Treatment may include:  Acid blockers for heartburn.  Anti-inflammatory medicine.  Pain medicine for inflammatory conditions.  Antibiotics if an infection is present.  You may be advised to change lifestyle habits. This includes stopping smoking and avoiding alcohol, caffeine, and chocolate.  You may be advised to keep your head raised (elevated) when sleeping. This reduces the chance of acid going backward from your stomach into your esophagus.  Most of the time, nonspecific chest pain will improve within 2 to 3 days with rest and mild pain medicine. HOME CARE INSTRUCTIONS   If antibiotics were prescribed, take your antibiotics as directed. Finish them even if you start to feel better.  For the next few days, avoid physical activities that bring on chest pain. Continue physical activities as directed.  Do not smoke.  Avoid drinking alcohol.  Only take over-the-counter or prescription medicine for pain, discomfort, or fever as directed by your caregiver.  Follow your caregiver's suggestions for further testing if your chest pain does not go away.  Keep any follow-up appointments you made. If you do not go to an appointment, you could develop lasting (chronic) problems with pain. If there is any problem keeping an appointment, you must call to reschedule. SEEK MEDICAL CARE IF:   You think you are having problems from the medicine you are taking. Read your medicine instructions carefully.  Your chest pain  does not go away, even after treatment.  You develop a rash with blisters on your chest. SEEK IMMEDIATE MEDICAL CARE IF:   You have increased chest pain or pain that spreads to your arm, neck, jaw, back, or abdomen.  You develop shortness of breath, an increasing cough, or you  are coughing up blood.  You have severe back or abdominal pain, feel nauseous, or vomit.  You develop severe weakness, fainting, or chills.  You have a fever. THIS IS AN EMERGENCY. Do not wait to see if the pain will go away. Get medical help at once. Call your local emergency services (911 in [place] Do not drive yourself to the hospital. MAKE SURE YOU:   Understand these instructions.  Will watch your condition.  Will get help right away if you are not doing well or get worse. Document Released: 07/19/2005 Document Revised: 01/01/2012 Document Reviewed: 05/14/2008 [facility] Patient Information 2014 [place].

## 2014-03-27 NOTE — ED Notes (Signed)
Pt c/o swelling in left arm, sts she had a heart cath done last Friday and they went into from right radial artery. Pt sts has had some hot flashes at home. Pt sts she spoke with her cardiologist and was told to come to ED to have it checked out. sts that sometimes she gets a tingling sensation in thumb sometimes. Pt unsure who her cardiologist is. Had heart cath done because she was having a NSTEMI. Denies CP and SOB. Nad, skin warm and dry, resp e/u.

## 2014-03-27 NOTE — Telephone Encounter (Signed)
The pt states that she has swelling from cath site in her right wrist to almost her elbow and that it is very painful, she is tearful while talking over the phone. She states that she has bruising at cath site but denies redness, drainage or fever in area.   Per Dr Radford Pax the pt is advised to go to the ER for evaluation of cath site. She verbalized understanding. Trish, Engineer, maintenance (IT), is aware that the pt is on her way to the ER.

## 2014-03-27 NOTE — Telephone Encounter (Signed)
New message    Patient calling from work   C/O arm swollen tingling sensation up to wrist - elbow . Pain is unbearable.

## 2014-03-27 NOTE — ED Provider Notes (Signed)
CSN: [id number]     Arrival date & time 03/27/14  1118 History   First MD Initiated Contact with Patient 03/27/14 1242     No chief complaint on file.    (Consider location/radiation/quality/duration/timing/severity/associated sxs/prior Treatment) The history is provided by the patient. No language interpreter was used.  Selena Peterson is a 23 y/o F with PMHx of anxiety, BV, Chlamydia presenting to the ED with right arm discomfort. Patient reports she's been having right wrist discomfort for the past week since she was discharged and since her cardiac cath performed on 03/20/2014. Reported that she's noticed swelling and ecchymosis identified to the right wrist with negative alleviation. Reported that she's been experiencing sharp pain from the right thumb up to the right shoulder intermittently. Stated that she called her cardiologist earlier this morning and spoke with the nurse, reported that the nurse recommended patient to calm to the ED to be assessed regarding possible infection. Patient reports that she's been having intermittent hot spells. Reported that she also experienced left-sided chest pain described as a sharp discomfort without radiation with associated shortness of breath. Stated that she has not been taking her aspirin as prescribed. Reported that she took her Cardizem earlier today. 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 Denied fever, chills, neck pain, neck stiffness, nausea, vomiting, diarrhea, dizziness, syncope, falls, injury. PCP none  Past Medical History  Diagnosis Date  . Chlamydia   . BV (bacterial vaginosis)   . Anxiety    Past Surgical History  Procedure Laterality Date  . Ankle fracture surgery     Family History  Problem Relation Age of Onset  . Diabetes Maternal Grandmother   . Hypertension Maternal Grandmother   . GER disease Mother   . Heart failure Maternal Grandmother    History  Substance Use Topics  . Smoking status: Current  Every Day Smoker -- 0.30 packs/day for 4 years    Types: Cigarettes  . Smokeless tobacco: Not on file  . Alcohol Use: Yes     Comment: Drinks 1 pint of liquor per week   OB History   Grav Para Term Preterm Abortions TAB SAB Ect Mult Living                 Review of Systems  Constitutional: Positive for diaphoresis. Negative for fever and chills.  Respiratory: Positive for shortness of breath. Negative for cough and chest tightness.   Cardiovascular: Positive for chest pain.  Gastrointestinal: Negative for nausea, vomiting, abdominal pain and diarrhea.  Musculoskeletal: Positive for arthralgias (right wrist). Negative for neck pain.  Neurological: Positive for numbness. Negative for weakness.      Allergies  Review of patient's allergies indicates no known allergies.  Home Medications   Prior to Admission medications   Medication Sig Start Date End Date Taking? Authorizing Provider  acetaminophen (TYLENOL) 500 MG tablet Take 1,000 mg by mouth every 6 (six) hours as needed for moderate pain.    Yes Historical Provider, MD  clonazePAM (KLONOPIN) 0.5 MG tablet Take 1 tablet (0.5 mg total) by mouth 2 (two) times daily as needed for anxiety. 03/23/14  Yes Dot Lanes, MD  diltiazem (CARDIZEM CD) 120 MG 24 hr capsule Take 1 capsule (120 mg total) by mouth daily. 03/21/14  Yes Perry Mount, PA-C   BP 143/87  Pulse 94  Temp(Src) 98.6 F (37 C) (Oral)  Resp 18  SpO2 100%  LMP 03/01/2014 Physical Exam  Nursing note and vitals reviewed. Constitutional: She is  oriented to person, place, and time. She appears well-developed and well-nourished. No distress.  HENT:  Head: Normocephalic and atraumatic.  Mouth/Throat: Oropharynx is clear and moist. No oropharyngeal exudate.  Eyes: Conjunctivae and EOM are normal. Pupils are equal, round, and reactive to light. Right eye exhibits no discharge. Left eye exhibits no discharge.  Neck: Normal range of motion. Neck supple. No tracheal  deviation present.  Negative neck stiffness Negative nuchal rigidity Negative cervical lymphadenopathy  Negative meningeal signs   Cardiovascular: Normal rate, regular rhythm and normal heart sounds.  Exam reveals no friction rub.   No murmur heard. Pulses:      Radial pulses are 2+ on the right side, and 2+ on the left side.       Dorsalis pedis pulses are 2+ on the right side, and 2+ on the left side.       Posterior tibial pulses are 2+ on the right side, and 2+ on the left side.  Negative swelling or pitting edema noted to the lower extremities bilaterally   Pulmonary/Chest: Effort normal and breath sounds normal. No respiratory distress. She has no wheezes. She has no rales. She exhibits no tenderness.  Patient is able to speak in full sentences without difficulty  Negative use of accessory muscles  Negative stridor   Musculoskeletal: Normal range of motion. She exhibits tenderness.       Arms: Mild swelling identified to the right wrist with negative deformities noted. Ecchymosis is healing noted. Full range of motion without difficulty-full pronation, supination, flexion, extension noted.  Lymphadenopathy:    She has no cervical adenopathy.  Neurological: She is alert and oriented to person, place, and time. No cranial nerve deficit. She exhibits normal muscle tone. Coordination normal.  Cranial nerves III-XII grossly intact Strength 5+/5+ to upper and lower extremities bilaterally with resistance applied, equal distribution noted Equal grip strength bilaterally Strength intact to MCP, PIP, DIP joints of right hand Sensation intact  Patient follows commands well  Skin: Skin is warm and dry. She is not diaphoretic.  Superficial abrasion that is scabbed over identified to the flexor surface of the right wrist with ecchymosis at the ceiling. Negative red streaks. Negative warmth upon palpation. Negative erythema noted.  Psychiatric: She has a normal mood and affect. Her behavior is  normal.    ED Course  Procedures (including critical care time)  Results for orders placed during the hospital encounter of 03/27/14  CBC WITH DIFFERENTIAL      Result Value Ref Range   WBC 11.4 (*) 4.0 - 10.5 K/uL   RBC 4.84  3.87 - 5.11 MIL/uL   Hemoglobin 14.8  12.0 - 15.0 g/dL   HCT 43.5  36.0 - 46.0 %   MCV 89.9  78.0 - 100.0 fL   MCH 30.6  26.0 - 34.0 pg   MCHC 34.0  30.0 - 36.0 g/dL   RDW 13.7  11.5 - 15.5 %   Platelets 363  150 - 400 K/uL   Neutrophils Relative % 60  43 - 77 %   Neutro Abs 6.9  1.7 - 7.7 K/uL   Lymphocytes Relative 30  12 - 46 %   Lymphs Abs 3.4  0.7 - 4.0 K/uL   Monocytes Relative 8  3 - 12 %   Monocytes Absolute 0.9  0.1 - 1.0 K/uL   Eosinophils Relative 1  0 - 5 %   Eosinophils Absolute 0.1  0.0 - 0.7 K/uL   Basophils Relative 1  0 -  1 %   Basophils Absolute 0.1  0.0 - 0.1 K/uL  COMPREHENSIVE METABOLIC PANEL      Result Value Ref Range   Sodium 138  137 - 147 mEq/L   Potassium 4.1  3.7 - 5.3 mEq/L   Chloride 102  96 - 112 mEq/L   CO2 21  19 - 32 mEq/L   Glucose, Bld 87  70 - 99 mg/dL   BUN 7  6 - 23 mg/dL   Creatinine, Ser 0.40 (*) 0.50 - 1.10 mg/dL   Calcium 9.4  8.4 - 10.5 mg/dL   Total Protein 8.2  6.0 - 8.3 g/dL   Albumin 4.2  3.5 - 5.2 g/dL   AST 14  0 - 37 U/L   ALT 18  0 - 35 U/L   Alkaline Phosphatase 65  39 - 117 U/L   Total Bilirubin 0.3  0.3 - 1.2 mg/dL   GFR calc non Af Amer >90  >90 mL/min   GFR calc Af Amer >90  >90 mL/min  TROPONIN I      Result Value Ref Range   Troponin I <0.30  <0.30 ng/mL  POC URINE PREG, ED      Result Value Ref Range   Preg Test, Ur NEGATIVE  NEGATIVE    Labs Review Labs Reviewed  CBC WITH DIFFERENTIAL - Abnormal; Notable for the following:    WBC 11.4 (*)    All other components within normal limits  COMPREHENSIVE METABOLIC PANEL - Abnormal; Notable for the following:    Creatinine, Ser 0.40 (*)    All other components within normal limits  TROPONIN I  POC URINE PREG, ED    Imaging  Review Dg Chest 2 View  03/27/2014   CLINICAL DATA:  chest pain  EXAM: CHEST  2 VIEW  COMPARISON:  Two view chest dated 03/23/2014  FINDINGS: The heart size and mediastinal contours are within normal limits. Both lungs are clear. The visualized skeletal structures are unremarkable.  IMPRESSION: No active cardiopulmonary disease.   Electronically Signed   By: Margaree Mackintosh M.D.   On: 03/27/2014 14:21     EKG Interpretation None      MDM   Final diagnoses:  Chest pain   Medications  acetaminophen (TYLENOL) tablet 650 mg (650 mg Oral Given 03/27/14 1437)  aspirin chewable tablet 81 mg (81 mg Oral Given 03/27/14 1638)   Filed Vitals:   03/27/14 1430 03/27/14 1530 03/27/14 1600 03/27/14 1630  BP: 131/79 136/83 133/85 143/87  Pulse: 93 90 89 94  Temp:      TempSrc:      Resp: 15 16 17 18   SpO2: 99% 100% 100% 100%    This provider reviewed patient's chart. Patient was seen and assessed in ED setting regarding chest pain-elevated troponins were identified at this point in time-patient was admitted to the hospital. Cardiac catheterization was performed on 03/20/2014 via radial artery access. Cardiac catheterization with negative acute findings. Patient was diagnosed with back spasm versus myopericarditis. Patient was discharged home. EKG noted sinus tachycardia with a heart rate of 114 bpm. Troponin negative elevation. CBC noted mild elevated white blood cell count of 11.4-negative left shift or leukocytosis noted. CMP kidneys function well, mildly low at 0.40. Negative elevated liver enzymes, alkaline phosphatase of bilirubin noted. Electrolytes properly balanced. Urine pregnancy negative. Chest x-ray negative for acute cardiopulmonary disease. PERC score 0 - doubt PE - patient had CT angiogram performed with negative findings for PE performed on 03/19/2014. Doubt blood  clot in RUE. Doubt pneumonia. Doubt thrombophlebitis. Doubt edema. Doubt lymphadenitis. Doubt cardiac issue. Doubt cellulitis.  Patient recently had a cardiac catheterization performed on 03/20/2014 with unremarkable findings-suspicion to be possible vessel spasms versus myopericarditis. Heart rate has decreased while in the ED setting, from 114 bpm to 90 bpm. Patient seen and assessed by attending physician, Dr. Crosby Oyster, who cleared patient for discharge. Patient stable, afebrile. Patient does not appear septic. Discharged patient. Discussed with patient to rest and stay hydrated. Discussed with patient to take medications as prescribed, highly recommended patient to take 81 mg ASA as prescribed. Referred to PCP and Cardiology - discussed with patient to keep appointment on 04/06/2014. Discussed with patient to closely monitor symptoms and if symptoms are to worsen or change to report back to the ED - strict return instructions given.  Patient agreed to plan of care, understood, all questions answered.   Jamse Mead, PA-C 03/27/14 1731  Jamse Mead, PA-C 03/27/14 1734

## 2014-04-01 ENCOUNTER — Ambulatory Visit (INDEPENDENT_AMBULATORY_CARE_PROVIDER_SITE_OTHER): Payer: No Typology Code available for payment source | Admitting: Physician Assistant

## 2014-04-01 ENCOUNTER — Encounter: Payer: Self-pay | Admitting: Physician Assistant

## 2014-04-01 VITALS — BP 122/90 | HR 80 | Ht 60.0 in | Wt 138.0 lb

## 2014-04-01 DIAGNOSIS — F17200 Nicotine dependence, unspecified, uncomplicated: Secondary | ICD-10-CM

## 2014-04-01 DIAGNOSIS — I214 Non-ST elevation (NSTEMI) myocardial infarction: Secondary | ICD-10-CM

## 2014-04-01 DIAGNOSIS — R030 Elevated blood-pressure reading, without diagnosis of hypertension: Secondary | ICD-10-CM

## 2014-04-01 DIAGNOSIS — F411 Generalized anxiety disorder: Secondary | ICD-10-CM

## 2014-04-01 DIAGNOSIS — F39 Unspecified mood [affective] disorder: Secondary | ICD-10-CM | POA: Insufficient documentation

## 2014-04-01 DIAGNOSIS — IMO0001 Reserved for inherently not codable concepts without codable children: Secondary | ICD-10-CM

## 2014-04-01 DIAGNOSIS — M79609 Pain in unspecified limb: Secondary | ICD-10-CM

## 2014-04-01 DIAGNOSIS — I219 Acute myocardial infarction, unspecified: Secondary | ICD-10-CM

## 2014-04-01 DIAGNOSIS — M79601 Pain in right arm: Secondary | ICD-10-CM

## 2014-04-01 DIAGNOSIS — F419 Anxiety disorder, unspecified: Secondary | ICD-10-CM

## 2014-04-01 NOTE — Assessment & Plan Note (Signed)
Blood pressure controlled. 

## 2014-04-01 NOTE — Assessment & Plan Note (Signed)
Patient's non-STEMI was felt secondary to vasospasm. She had normal coronary arteries on cardiac cath. She has a significant amount of anxiety which I believe is contributing to her chest pain. She actually says she is breathing easier and able to do more since the diltiazem has been started. Continue aspirin and diltiazem. Followup with Dr. Radford Pax in 2 months. Consider fasting lipid panel at that time.

## 2014-04-01 NOTE — Assessment & Plan Note (Signed)
Patient has a great deal of anxiety. I've asked her to followup with [facility] for further evaluation and treatment.

## 2014-04-01 NOTE — Assessment & Plan Note (Signed)
Patient states she quit smoking and is no longer using marijuana.

## 2014-04-01 NOTE — Assessment & Plan Note (Signed)
Patient complains of right arm pain at cath site. She has 2 tight bracelets wrapped around her arm at the site. I've asked her to remove these and not wear anything tight around her wrist. Her cath site looks good without hematoma or hemorrhage or swelling.

## 2014-04-01 NOTE — Patient Instructions (Signed)
Your physician recommends that you schedule a follow-up appointment in: [address] [place]  Your physician recommends that you continue on your current medications as directed. Please refer to the Current Medication list given to you today.  YOUR PROVIDER WOULD LIKE FOR YOU TO MAKE AN APPOINTMENT WITH [facility] FOR YOUR ANXIETY

## 2014-04-01 NOTE — Progress Notes (Signed)
HPI: This is a 23 year old female patient who came to the hospital with chest pain associated with elevated troponin of 2.52. Her EKG was nonacute, CPK was normal but MB was elevated. CT and she ruled out PE and 2-D echo and head CT were also normal. Urine drug screen was positive for THC and barbiturates. Cardiac catheterization on 03/10/14 showed no angiographic evidence of CAD and normal LV function. Her chest pain and elevated cardiac enzymes were thought possibly do to coronary spasm or mild  This. She was started on diltiazem CD 120 mg daily and baby aspirin.  The patient is here today for followup. She continues to have a lot of anxiety associated with palpitations and sharp chest pain. She was started on Klonopin but says it does not help her anxiety and this makes her sleepy. She is asking for Xanax or Ativan. She is followed by [facility] and has not schedule appointment yet. She also complains of swelling and pain over right arm at her cath site. She has 2 tight bracelets wrapped around her right arm. She says Tylenol isn't helping her pain.  No Known Allergies  Current Outpatient Prescriptions on File Prior to Visit: acetaminophen (TYLENOL) 500 MG tablet, Take 1,000 mg by mouth every 6 (six) hours as needed for moderate pain. , Disp: , Rfl:  clonazePAM (KLONOPIN) 0.5 MG tablet, Take 1 tablet (0.5 mg total) by mouth 2 (two) times daily as needed for anxiety., Disp: 30 tablet, Rfl: 0 diltiazem (CARDIZEM CD) 120 MG 24 hr capsule, Take 1 capsule (120 mg total) by mouth daily., Disp: 30 capsule, Rfl: 11  No current facility-administered medications on file prior to visit.   Past Medical History:   Chlamydia                                                    BV (bacterial vaginosis)                                     Anxiety                                                     Past Surgical History:   ANKLE FRACTURE SURGERY                                       Review of  patient's family history indicates:   Diabetes                       Maternal Grandmother     Hypertension                   Maternal Grandmother     GER disease                    Mother                   Heart failure  Maternal Grandmother     Social History   Marital Status: Single              Spouse Name:                      Years of Education:                 Number of children:             Occupational History   None on file  Social History Main Topics   Smoking Status: Former Smoker                   Packs/Day: 0.30  Years: 4         Types: Cigarettes   Smokeless Status: Not on file                      Alcohol Use: Yes               Comment: Drinks 1 pint of liquor per week   Drug Use: No                Comment: History of frequent marijuana use   Sexual Activity: Yes                    Birth Control/Protection: None  Other Topics            Concern   None on file  Social History Narrative   Works at the [employer]. Lives alone.     ROS: See history of present illness otherwise negative   PHYSICAL EXAM: Well-nournished, in no acute distress. Neck: No JVD, HJR, Bruit, or thyroid enlargement  Lungs: No tachypnea, clear without wheezing, rales, or rhonchi  Cardiovascular: RRR, PMI not displaced, heart sounds normal, no murmurs, gallops, bruit, thrill, or heave.  Abdomen: BS normal. Soft without organomegaly, masses, lesions or tenderness.  Extremities: Right arm and cath site looks good without hematoma or hemorrhage good radial and brachial pulses no swelling, lower extremities without cyanosis, clubbing or edema. Good distal pulses bilateral  SKin: Warm, no lesions or rashes   Musculoskeletal: No deformities  Neuro: no focal signs  BP 122/90  Pulse 80  Ht 5' (1.524 m)  Wt 138 lb (62.596 kg)  BMI 26.95 kg/m2  LMP 03/01/2014   EKG: Normal sinus rhythm, normal EKG  Ct Head Wo Contrast  03/19/2014   CLINICAL DATA:   Headache, abnormal troponins  EXAM: CT HEAD WITHOUT CONTRAST  TECHNIQUE: Contiguous axial images were obtained from the base of the skull through the vertex without intravenous contrast.  COMPARISON:  None. FINDINGS: Negative for acute intracranial hemorrhage, acute infarction, mass, mass effect, hydrocephalus or midline shift. Gray-white differentiation is preserved throughout. No acute soft tissue or calvarial abnormality. The globes and orbits are symmetric and unremarkable. Normal aeration of the mastoid air cells and visualized paranasal sinuses.  IMPRESSION: Negative head CT.   Ct Angio Chest Pe W/cm &/or Wo Cm  03/19/2014   CLINICAL DATA:  23 year old female with headache, chest pain. Initial encounter.  EXAM: CT ANGIOGRAPHY CHEST WITH CONTRAST  TECHNIQUE: Multidetector CT imaging of the chest was performed using the standard protocol during bolus administration of intravenous contrast. Multiplanar CT image reconstructions and MIPs were obtained to evaluate the vascular anatomy.  CONTRAST:  157mL OMNIPAQUE IOHEXOL 350 MG/ML SOLN  COMPARISON:  Chest radiographs 0803 hr the same day.  FINDINGS: Good contrast bolus timing in the pulmonary arterial tree. Motion artifact at the lung bases. No focal filling defect identified in the pulmonary arterial tree to suggest the presence of acute pulmonary embolism.  Major airways are patent. The lungs are clear except for minimal dependent atelectasis. No pericardial or pleural effusion. Negative thoracic inlet. Visualized aorta is normal. No mediastinal or axillary lymphadenopathy. Negative visualized upper abdominal viscera.  No acute osseous abnormality identified.  Review of the MIP images confirms the above findings.  IMPRESSION: No evidence of acute pulmonary embolus.  Negative chest CTA.      Cardiac Catheterization Operative Report   ZENDA HERSKOWITZ   [id number]   5/29/20154:59 PM   Pcp Not In System   Procedure Performed:  1. Left Heart  Catheterization 2. Selective Coronary Angiography 3. Left ventricular angiogram Operator: Lauree Chandler, MD  Arterial access site: Right radial artery.   Indication: 23 yo female admitted with chest pain, NSTEMI. Echo with normal LVEF. CTA negative for PE.   Procedure Details:   The risks, benefits, complications, treatment options, and expected outcomes were discussed with the patient. The patient and/or family concurred with the proposed plan, giving informed consent. The patient was brought to the cath lab after IV hydration was begun and oral premedication was given. The patient was further sedated with Versed and Fentanyl. The right wrist was assessed with an Allens test which was positive. The right wrist was prepped and draped in a sterile fashion. 1% lidocaine was used for local anesthesia. Using the modified Seldinger access technique, a 5 French sheath was placed in the right radial artery. 3 mg Verapamil was given through the sheath. 3500 units IV heparin was given. Standard diagnostic catheters were used to perform selective coronary angiography. A pigtail catheter was used to perform a left ventricular angiogram. The sheath was removed from the right radial artery and a Terumo hemostasis band was applied at the arteriotomy site on the right wrist.   There were no immediate complications. The patient was taken to the recovery area in stable condition.   Hemodynamic Findings:   Central aortic pressure: 144/89   Left ventricular pressure: 137/5/10   Angiographic Findings:   Left main: No obstructive disease.   Left Anterior Descending Artery: Large caliber vessel that courses to the apex. Moderate caliber diagonal branch. No obstructive disease. No evidence of dissection.   Circumflex Artery: Large caliber vessel with large intermediate branch, moderate caliber obtuse marginal branch. No obstructive disease. No evidence of dissection.   Right Coronary Artery: Large dominant vessel.  No obstructive disease. No evidence of dissection.   Left Ventricular Angiogram: LVEF=60%.   Impression:  1. No angiographic evidence of CAD   2. Normal LV function   3. Possible coronary vasospasm or myopericarditis   Recommendations: Further management per primary team. No further ischemia evaluation.   Complications: None. The patient tolerated the procedure well.    2-D echo 03/19/14 Study Conclusions  - Left ventricle: The cavity size was normal. Systolic function was   normal. The estimated ejection fraction was 55%. Wall motion was   normal; there were no regional wall motion abnormalities. - Tricuspid valve: There was trivial regurgitation.

## 2014-04-06 ENCOUNTER — Encounter: Payer: Self-pay | Admitting: Physician Assistant

## 2014-04-07 ENCOUNTER — Ambulatory Visit: Payer: Self-pay | Admitting: Emergency Medicine

## 2014-04-23 ENCOUNTER — Ambulatory Visit (INDEPENDENT_AMBULATORY_CARE_PROVIDER_SITE_OTHER): Payer: No Typology Code available for payment source | Admitting: Family Medicine

## 2014-04-23 ENCOUNTER — Encounter: Payer: Self-pay | Admitting: Family Medicine

## 2014-04-23 VITALS — BP 139/93 | HR 75 | Temp 97.9°F

## 2014-04-23 DIAGNOSIS — N949 Unspecified condition associated with female genital organs and menstrual cycle: Secondary | ICD-10-CM

## 2014-04-23 DIAGNOSIS — F39 Unspecified mood [affective] disorder: Secondary | ICD-10-CM

## 2014-04-23 DIAGNOSIS — R079 Chest pain, unspecified: Secondary | ICD-10-CM

## 2014-04-23 DIAGNOSIS — N938 Other specified abnormal uterine and vaginal bleeding: Secondary | ICD-10-CM

## 2014-04-23 DIAGNOSIS — N925 Other specified irregular menstruation: Secondary | ICD-10-CM

## 2014-04-23 DIAGNOSIS — N926 Irregular menstruation, unspecified: Secondary | ICD-10-CM

## 2014-04-23 LAB — POCT URINE PREGNANCY: Preg Test, Ur: NEGATIVE

## 2014-04-23 MED ORDER — NORGESTIMATE-ETH ESTRADIOL 0.25-35 MG-MCG PO TABS: 1.0000 | ORAL_TABLET | Freq: Every day | ORAL | Status: DC

## 2014-04-23 MED ORDER — CITALOPRAM HYDROBROMIDE 20 MG PO TABS: 20.0000 mg | ORAL_TABLET | Freq: Every day | ORAL | Status: DC

## 2014-04-23 MED ORDER — LORAZEPAM 0.5 MG PO TABS: 0.5000 mg | ORAL_TABLET | Freq: Two times a day (BID) | ORAL | Status: DC | PRN

## 2014-04-23 NOTE — Progress Notes (Signed)
Patient ID: Selena Peterson, female   DOB: [birth year], 23 y.o.   MRN: [id number]  Kenn File, MD Phone: [phone]  Subjective:  Chief complaint-noted  Pt Here for , and painful periods, chest and followup  Anxiety States that she's felt anxious for several years until he got worse since her recent hospitalization. She and describes several episodes recently of palpitations, sweating, and feeling like she is going by that last around half an hour. She's tried Klonopin which makes her sleepy for entire day so she would like to change to another medication.  Chest pain Recently admitted to the hospital with positive troponin but unchanged EKG. Echo, CT angio of, and catheterization all were negative. Most likely a troponin bump was due to myocarditis versus vasospasm. She denies any history of cocaine use.  Painful periods Has been you having painful periods for years. She also describes them as heavy, she describes 5 days of bleeding every 4 weeks using 8 pads per day. She has severe cramps the week before her period which is currently suffering. She's tried NSAIDs with no help. She has irregular periods with Implanon, she would not like an IUD. She's had one partner for the last 9 months and routinely has unprotected sex, she is concerned about STI's and would like to be tested on her next visit.  ROS-  Per history of present illness  Past Medical History Patient Active Problem List   Diagnosis Date Noted  . Mood disorder 04/01/2014  . Arm pain, right 04/01/2014  . NSTEMI (non-ST elevated myocardial infarction) 03/20/2014  . Chest pain 03/19/2014  . Elevated troponin 03/19/2014  . Pain, dental 03/13/2014  . Nasal congestion 09/03/2013  . Elevated blood pressure 11/12/2012  . IRREGULAR MENSES 06/30/2010  . TOBACCO USER 10/25/2009    Medications- reviewed and updated Current Outpatient Prescriptions  Medication Sig Dispense Refill  . acetaminophen (TYLENOL) 500 MG  tablet Take 1,000 mg by mouth every 6 (six) hours as needed for moderate pain.       Marland Kitchen aspirin 81 MG tablet Take 81 mg by mouth daily.      . citalopram (CELEXA) 20 MG tablet Take 1 tablet (20 mg total) by mouth daily. Increase to 2 tabs daily after 2 weeks, please follow up in 2 weeks  45 tablet  0  . clonazePAM (KLONOPIN) 0.5 MG tablet Take 1 tablet (0.5 mg total) by mouth 2 (two) times daily as needed for anxiety.  30 tablet  0  . diltiazem (CARDIZEM CD) 120 MG 24 hr capsule Take 1 capsule (120 mg total) by mouth daily.  30 capsule  11  . LORazepam (ATIVAN) 0.5 MG tablet Take 1 tablet (0.5 mg total) by mouth 2 (two) times daily as needed for anxiety.  30 tablet  1  . norgestimate-ethinyl estradiol (SPRINTEC 28) 0.25-35 MG-MCG tablet Take 1 tablet by mouth daily. Take placebos only every 3 months  3 Package  5   No current facility-administered medications for this visit.    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   Dysmenorrhea Not much improvement with NSAIDs Prescribed Sprintec, advised to take placebo pills only every third package to accomplish 4 periods per year Reviewed menstrual history in detail which is reasonable but on the heavier side. Followup 2 weeks  Chest pain Current symptoms very suspicious for anxiety attacks Had full cardiac workup in the hospital including TTE, cardiac cath, and EKGs. These were all normal Troponins were elevated, ultimately  felt to be due to myocarditis versus vasospasm Discussed in detail with patient Has cardiology followup scheduled, encouraged followup with them.    Orders Placed This Encounter  Procedures  . POCT urine pregnancy    Meds ordered this encounter  Medications  . citalopram (CELEXA) 20 MG tablet    Sig: Take 1 tablet (20 mg total) by mouth daily. Increase to 2 tabs daily after 2 weeks, please follow up in 2 weeks    Dispense:  45 tablet    Refill:  0  . LORazepam (ATIVAN) 0.5 MG tablet    Sig: Take 1 tablet (0.5 mg total) by mouth 2 (two) times daily as needed for anxiety.    Dispense:  30 tablet    Refill:  1  . norgestimate-ethinyl estradiol (SPRINTEC 28) 0.25-35 MG-MCG tablet    Sig: Take 1 tablet by mouth daily. Take placebos only every 3 months    Dispense:  3 Package    Refill:  5

## 2014-04-23 NOTE — Assessment & Plan Note (Signed)
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

## 2014-04-23 NOTE — Patient Instructions (Signed)
Great to see you again  Please come back in 2 weeks for follow up mood and the other concerns you have

## 2014-04-23 NOTE — Assessment & Plan Note (Addendum)
Not much improvement with NSAIDs Prescribed Sprintec, advised to take placebo pills only every third package to accomplish 4 periods per year Reviewed menstrual history in detail which is reasonable but on the heavier side. Urine pregnancy test negative today Followup 2 weeks

## 2014-04-23 NOTE — Assessment & Plan Note (Signed)
Current symptoms very suspicious for anxiety attacks Had full cardiac workup in the hospital including TTE, cardiac cath, and EKGs. These were all normal Troponins were elevated, ultimately felt to be due to myocarditis versus vasospasm Discussed in detail with patient Has cardiology followup scheduled, encouraged followup with them.

## 2014-05-04 ENCOUNTER — Telehealth: Payer: Self-pay | Admitting: Family Medicine

## 2014-05-04 NOTE — Telephone Encounter (Signed)
Patient states she just called [facility] and the clinic states they never received patient's referral. Please resend referral.

## 2014-05-04 NOTE — Telephone Encounter (Signed)
Re-faxed dental referral and notified patient.Selena Peterson, Selena Peterson

## 2014-05-07 ENCOUNTER — Telehealth: Payer: Self-pay | Admitting: Family Medicine

## 2014-05-07 ENCOUNTER — Telehealth: Payer: Self-pay | Admitting: Surgery

## 2014-05-07 ENCOUNTER — Other Ambulatory Visit (HOSPITAL_COMMUNITY)
Admission: RE | Admit: 2014-05-07 | Discharge: 2014-05-07 | Disposition: A | Discharge status: Home / Self Care | Payer: No Typology Code available for payment source | Source: Ambulatory Visit | Attending: Family Medicine | Admitting: Family Medicine

## 2014-05-07 ENCOUNTER — Ambulatory Visit (INDEPENDENT_AMBULATORY_CARE_PROVIDER_SITE_OTHER): Payer: No Typology Code available for payment source | Admitting: Family Medicine

## 2014-05-07 ENCOUNTER — Telehealth (HOSPITAL_BASED_OUTPATIENT_CLINIC_OR_DEPARTMENT_OTHER): Payer: Self-pay | Admitting: Emergency Medicine

## 2014-05-07 ENCOUNTER — Encounter: Payer: Self-pay | Admitting: Family Medicine

## 2014-05-07 VITALS — BP 122/84 | HR 88 | Temp 98.1°F | Wt 139.0 lb

## 2014-05-07 DIAGNOSIS — F17200 Nicotine dependence, unspecified, uncomplicated: Secondary | ICD-10-CM

## 2014-05-07 DIAGNOSIS — Z113 Encounter for screening for infections with a predominantly sexual mode of transmission: Secondary | ICD-10-CM | POA: Insufficient documentation

## 2014-05-07 DIAGNOSIS — N898 Other specified noninflammatory disorders of vagina: Secondary | ICD-10-CM | POA: Insufficient documentation

## 2014-05-07 DIAGNOSIS — N899 Noninflammatory disorder of vagina, unspecified: Secondary | ICD-10-CM

## 2014-05-07 DIAGNOSIS — F39 Unspecified mood [affective] disorder: Secondary | ICD-10-CM

## 2014-05-07 DIAGNOSIS — N946 Dysmenorrhea, unspecified: Secondary | ICD-10-CM

## 2014-05-07 LAB — POCT WET PREP (WET MOUNT): CLUE CELLS WET PREP WHIFF POC: NEGATIVE

## 2014-05-07 LAB — RPR

## 2014-05-07 LAB — HIV ANTIBODY (ROUTINE TESTING W REFLEX): HIV: NONREACTIVE

## 2014-05-07 MED ORDER — FLUCONAZOLE 150 MG PO TABS: 150.0000 mg | ORAL_TABLET | Freq: Once | ORAL | Status: DC

## 2014-05-07 MED ORDER — METRONIDAZOLE 500 MG PO TABS: 500.0000 mg | ORAL_TABLET | Freq: Two times a day (BID) | ORAL | Status: DC

## 2014-05-07 NOTE — Telephone Encounter (Signed)
ED CM received call from patient regarding medication assistance. Pt was seen at [facility] and states, she cannot afford her discharge medication. Reviewed discharge medications. Explained to patient that she is not eligible for MATCH, but I could send her a discount card for the medication by text. Patient agreeable. Confirmation of text as per patient. Patient verbalized appreciation. No further ED CM needs identified.

## 2014-05-07 NOTE — Progress Notes (Signed)
Patient ID: Selena Peterson, female   DOB: 05-27-1991, 23 y.o.   MRN: [id number]  Kenn File, MD Phone: [phone]  Subjective:  Chief complaint-noted  Pt Here for followup anxiety, concern for STI  STI concern, vaginal itching/irritation HAs had vaginal irritation for a few weeks,  One parthen in the last 6 mionths, would like to be tested today No vaginal odor or discharge.  Anxiety Has used it in 2-3 times since our last visit. She has started taking Celexa but has not taken everyday. She denies abdominal pain drowsiness or any other side effects due to Celexa She has a trip planned to the beach  Dysmenorrhea Has started taking her Sprintec without problems, she is a nonsmoker   She has recently started living on her own and feels much better She has a vacation to [place] coming up.   ROS-  Per HPI Plus mild intermittent HEadaches  Past Medical History Patient Active Problem List   Diagnosis Date Noted  . Vaginal irritation 05/07/2014  . Mood disorder 04/01/2014  . Arm pain, right 04/01/2014  . NSTEMI (non-ST elevated myocardial infarction) 03/20/2014  . Chest pain 03/19/2014  . Elevated troponin 03/19/2014  . Pain, dental 03/13/2014  . Nasal congestion 09/03/2013  . Elevated blood pressure 11/12/2012  . Dysmenorrhea 06/30/2010  . TOBACCO USER 10/25/2009    Medications- reviewed and updated Current Outpatient Prescriptions  Medication Sig Dispense Refill  . acetaminophen (TYLENOL) 500 MG tablet Take 1,000 mg by mouth every 6 (six) hours as needed for moderate pain.       Marland Kitchen aspirin 81 MG tablet Take 81 mg by mouth daily.      . citalopram (CELEXA) 20 MG tablet Take 1 tablet (20 mg total) by mouth daily. Increase to 2 tabs daily after 2 weeks, please follow up in 2 weeks  45 tablet  0  . diltiazem (CARDIZEM CD) 120 MG 24 hr capsule Take 1 capsule (120 mg total) by mouth daily.  30 capsule  11  . LORazepam (ATIVAN) 0.5 MG tablet Take 1 tablet (0.5 mg  total) by mouth 2 (two) times daily as needed for anxiety.  30 tablet  1  . norgestimate-ethinyl estradiol (SPRINTEC 28) 0.25-35 MG-MCG tablet Take 1 tablet by mouth daily. Take placebos only every 3 months  3 Package  5   No current facility-administered medications for this visit.    Objective: BP 122/84  Pulse 88  Temp(Src) 98.1 F (36.7 C) (Oral)  Wt 139 lb (63.05 kg)  LMP 04/22/2014 Gen: NAD, alert, cooperative with exam HEENT: NCAT Ext: No edema, warm Neuro: Alert and oriented, No gross deficits GU: Vaginal wall well regaining and pink, minimal thick white cervical discharge, no cervical motion tenderness, no adnexal fullness,   Assessment/Plan:  Vaginal irritation She has some concern for STI like to be tested GC, wet prep, HIV and RPR Continue Sprintec, encourage condom use   TOBACCO USER Nonsmoker  Mood disorder Mixed with mostly features of anxiety Continue Celexa Ativan use seems reasonable, encouraged using sparingly Followup 4 weeks  Dysmenorrhea Continue Sprintec, she began using it Nonsmoker    Orders Placed This Encounter  Procedures  . GC/Chlamydia Probe Amp  . HIV antibody (with reflex)  . RPR  . POCT Wet Prep [facility])

## 2014-05-07 NOTE — Patient Instructions (Signed)
Good to see you today!  Continue the celexa daily, use the ativan sparingly Come back in 4-6 weeks  Have fun at the beach!   Generalized Anxiety Disorder Generalized anxiety disorder (GAD) is a mental disorder. It interferes with life functions, including relationships, work, and school. GAD is different from normal anxiety, which everyone experiences at some point in their lives in response to specific life events and activities. Normal anxiety actually helps [place] prepare for and get through these life events and activities. Normal anxiety goes away after the event or activity is over.  GAD causes anxiety that is not necessarily related to specific events or activities. It also causes excess anxiety in proportion to specific events or activities. The anxiety associated with GAD is also difficult to control. GAD can vary from mild to severe. People with severe GAD can have intense waves of anxiety with physical symptoms (panic attacks).  SYMPTOMS The anxiety and worry associated with GAD are difficult to control. This anxiety and worry are related to many life events and activities and also occur more days than not for 6 months or longer. People with GAD also have three or more of the following symptoms (one or more in children):  Restlessness.   Fatigue.  Difficulty concentrating.   Irritability.  Muscle tension.  Difficulty sleeping or unsatisfying sleep. DIAGNOSIS GAD is diagnosed through an assessment by your caregiver. Your caregiver will ask you questions aboutyour mood,physical symptoms, and events in your life. Your caregiver may ask you about your medical history and use of alcohol or drugs, including prescription medications. Your caregiver may also do a physical exam and blood tests. Certain medical conditions and the use of certain substances can cause symptoms similar to those associated with GAD. Your caregiver may refer you to a mental health specialist for further  evaluation. TREATMENT The following therapies are usually used to treat GAD:   Medication--Antidepressant medication usually is prescribed for long-term daily control. Antianxiety medications may be added in severe cases, especially when panic attacks occur.   Talk therapy (psychotherapy)--Certain types of talk therapy can be helpful in treating GAD by providing support, education, and guidance. A form of talk therapy called cognitive behavioral therapy can teach you healthy ways to think about and react to daily life events and activities.  Stress managementtechniques--These include yoga, meditation, and exercise and can be very helpful when they are practiced regularly. A mental health specialist can help determine which treatment is best for you. Some people see improvement with one therapy. However, other people require a combination of therapies. Document Released: 02/03/2013 Document Reviewed: 02/03/2013 [facility] Patient Information 2015 [place]. This information is not intended to replace advice given to you by your health care provider. Make sure you discuss any questions you have with your health care provider.

## 2014-05-07 NOTE — Telephone Encounter (Signed)
Wet prep shows BV and yeast infection. Sent diflucan and flagyl. Pt notified by phone.   Laroy Apple, MD [place] Resident, PGY-3 05/07/2014, 1:14 PM

## 2014-05-07 NOTE — Assessment & Plan Note (Signed)
She has some concern for STI like to be tested GC, wet prep, HIV and RPR Continue Sprintec, encourage condom use

## 2014-05-07 NOTE — Assessment & Plan Note (Signed)
Mixed with mostly features of anxiety Continue Celexa Ativan use seems reasonable, encouraged using sparingly Followup 4 weeks

## 2014-05-07 NOTE — Assessment & Plan Note (Signed)
Non-smoker

## 2014-05-07 NOTE — Assessment & Plan Note (Signed)
Continue Sprintec, she began using it Nonsmoker

## 2014-05-08 ENCOUNTER — Emergency Department (HOSPITAL_COMMUNITY)
Admission: EM | Admit: 2014-05-08 | Discharge: 2014-05-08 | Disposition: A | Discharge status: Home / Self Care | Payer: No Typology Code available for payment source | Attending: Emergency Medicine | Admitting: Emergency Medicine

## 2014-05-08 ENCOUNTER — Encounter (HOSPITAL_COMMUNITY): Payer: Self-pay | Admitting: Emergency Medicine

## 2014-05-08 DIAGNOSIS — Z8619 Personal history of other infectious and parasitic diseases: Secondary | ICD-10-CM | POA: Insufficient documentation

## 2014-05-08 DIAGNOSIS — K089 Disorder of teeth and supporting structures, unspecified: Secondary | ICD-10-CM | POA: Insufficient documentation

## 2014-05-08 DIAGNOSIS — Z8742 Personal history of other diseases of the female genital tract: Secondary | ICD-10-CM | POA: Insufficient documentation

## 2014-05-08 DIAGNOSIS — F411 Generalized anxiety disorder: Secondary | ICD-10-CM | POA: Insufficient documentation

## 2014-05-08 DIAGNOSIS — Z79899 Other long term (current) drug therapy: Secondary | ICD-10-CM | POA: Insufficient documentation

## 2014-05-08 DIAGNOSIS — F17200 Nicotine dependence, unspecified, uncomplicated: Secondary | ICD-10-CM | POA: Insufficient documentation

## 2014-05-08 DIAGNOSIS — Z792 Long term (current) use of antibiotics: Secondary | ICD-10-CM | POA: Insufficient documentation

## 2014-05-08 DIAGNOSIS — K0889 Other specified disorders of teeth and supporting structures: Secondary | ICD-10-CM

## 2014-05-08 DIAGNOSIS — Z7982 Long term (current) use of aspirin: Secondary | ICD-10-CM | POA: Insufficient documentation

## 2014-05-08 DIAGNOSIS — I252 Old myocardial infarction: Secondary | ICD-10-CM | POA: Insufficient documentation

## 2014-05-08 MED ORDER — TRAMADOL-ACETAMINOPHEN 37.5-325 MG PO TABS: 1.0000 | ORAL_TABLET | Freq: Four times a day (QID) | ORAL | Status: DC | PRN

## 2014-05-08 MED ORDER — HYDROCODONE-ACETAMINOPHEN 5-325 MG PO TABS
1.0000 | ORAL_TABLET | Freq: Once | ORAL | Status: AC
  Administered 2014-05-08: 1 via ORAL
  Filled 2014-05-08: qty 1

## 2014-05-08 NOTE — Discharge Instructions (Signed)
Take the prescribed medication as directed. Follow-up with your dentist on Tuesday.  Selena Peterson will contact you Monday if any changes to orange card/dental follow-up. Return to the ED for new or worsening symptoms.

## 2014-05-08 NOTE — ED Notes (Signed)
Rt upper tooth pain for a week got worse yesterday  States s[poke to dentist and she has appointment on Tuesday but pain is too bad

## 2014-05-08 NOTE — ED Provider Notes (Signed)
CSN: [id number]     Arrival date & time 05/08/14  1015 History  This chart was scribed for non-physician practitioner, Quincy Carnes, PA-C,working with Wandra Arthurs, MD, by Marlowe Kays, ED Scribe.  This patient was seen in room TR08C/TR08C and the patient's care was started at 10:46 AM.  Chief Complaint  Patient presents with  . Dental Pain   The history is provided by the patient. No language interpreter was used.   HPI Comments:  Selena Peterson is a 23 y.o. female who presents to the Emergency Department complaining of upper right throbbing, shooting tooth pain and gum swelling onset one week. Pt states the pain got worse yesterday and she cannot be seen by her dentist yet. She states has a Pharmacist, community appt with [facility] in four days. She denies bleeding, drainage, fever, or chills.   Has been taking OTC meds without relief.  Past Medical History  Diagnosis Date  . Chlamydia   . BV (bacterial vaginosis)   . Anxiety   . MI (myocardial infarction)    Past Surgical History  Procedure Laterality Date  . Ankle fracture surgery    . Cardiac catheterization     Family History  Problem Relation Age of Onset  . Diabetes Maternal Grandmother   . Hypertension Maternal Grandmother   . GER disease Mother   . Heart failure Maternal Grandmother    History  Substance Use Topics  . Smoking status: Current Every Day Smoker -- 0.30 packs/day for 4 years    Types: Cigarettes  . Smokeless tobacco: Not on file  . Alcohol Use: Yes     Comment: Drinks 1 pint of liquor per week   OB History   Grav Para Term Preterm Abortions TAB SAB Ect Mult Living                 Review of Systems  Constitutional: Negative for fever and chills.  HENT: Positive for dental problem.   All other systems reviewed and are negative.   Allergies  Review of patient's allergies indicates no known allergies.  Home Medications   Prior to Admission medications   Medication Sig Start Date End Date Taking?  Authorizing Provider  acetaminophen (TYLENOL) 500 MG tablet Take 1,000 mg by mouth every 6 (six) hours as needed for moderate pain.     Historical Provider, MD  aspirin 81 MG tablet Take 81 mg by mouth daily.    Historical Provider, MD  citalopram (CELEXA) 20 MG tablet Take 1 tablet (20 mg total) by mouth daily. Increase to 2 tabs daily after 2 weeks, please follow up in 2 weeks 04/23/14   Timmothy Euler, MD  diltiazem (CARDIZEM CD) 120 MG 24 hr capsule Take 1 capsule (120 mg total) by mouth daily. 03/21/14   Perry Mount, PA-C  fluconazole (DIFLUCAN) 150 MG tablet Take 1 tablet (150 mg total) by mouth once. And repeat in 3 days 05/07/14   Timmothy Euler, MD  LORazepam (ATIVAN) 0.5 MG tablet Take 1 tablet (0.5 mg total) by mouth 2 (two) times daily as needed for anxiety. 04/23/14   Timmothy Euler, MD  metroNIDAZOLE (FLAGYL) 500 MG tablet Take 1 tablet (500 mg total) by mouth 2 (two) times daily. Avoid alcohol while taking 05/07/14   Timmothy Euler, MD  norgestimate-ethinyl estradiol ([place] 28) 0.25-35 MG-MCG tablet Take 1 tablet by mouth daily. Take placebos only every 3 months 04/23/14   Timmothy Euler, MD   Triage Vitals: BP 149/97  Pulse 93  Temp(Src) 98.4 F (36.9 C) (Oral)  Resp 16  SpO2 100%  LMP 04/22/2014 Physical Exam  Nursing note and vitals reviewed. Constitutional: She is oriented to person, place, and time. She appears well-developed and well-nourished.  HENT:  Head: Normocephalic and atraumatic.  Mouth/Throat: Oropharynx is clear and moist.  Teeth largely in good dentition,  Right upper molar broken with large amount of decay, surrounding gingiva normal in appearance without signs of dental abscess, handling secretions appropriately, no trismus  Eyes: Conjunctivae and EOM are normal. Pupils are equal, round, and reactive to light.  Neck: Normal range of motion.  Cardiovascular: Normal rate, regular rhythm and normal heart sounds.   Pulmonary/Chest: Effort normal and  breath sounds normal.  Abdominal: Soft. Bowel sounds are normal.  Musculoskeletal: Normal range of motion.  Neurological: She is alert and oriented to person, place, and time.  Skin: Skin is warm and dry.  Psychiatric: She has a normal mood and affect.    ED Course  Procedures (including critical care time) DIAGNOSTIC STUDIES: Oxygen Saturation is 100% on RA, normal by my interpretation.   COORDINATION OF CARE: 10:49 AM- Will prescribe pain medication. Pt verbalizes understanding and agrees to plan.  Medications  HYDROcodone-acetaminophen (NORCO/VICODIN) 5-325 MG per tablet 1 tablet (not administered)    Labs Review Labs Reviewed - No data to display  Imaging Review No results found.   EKG Interpretation None      MDM   Final diagnoses:  Pain, dental   Dental pain without signs of dental abscess.  Pt given dose of vicodin in the ED, Rx ultracet for pain control.  FU with dentist on Tuesday.  Felicia from case management has seen pt and will call Monday regarding orange card.  Discussed plan with patient, he/she acknowledged understanding and agreed with plan of care.  Return precautions given for new or worsening symptoms.  I personally performed the services described in this documentation, which was scribed in my presence. The recorded information has been reviewed and is accurate.  Larene Pickett, PA-C 05/08/14 1225

## 2014-05-11 NOTE — ED Provider Notes (Signed)
Medical screening examination/treatment/procedure(s) were performed by non-physician practitioner and as supervising physician I was immediately available for consultation/collaboration.   EKG Interpretation None        Wandra Arthurs, MD 05/11/14 [phone]

## 2014-05-12 ENCOUNTER — Telehealth: Payer: Self-pay | Admitting: Family Medicine

## 2014-05-12 NOTE — Telephone Encounter (Signed)
PT with negative HIV, RPR, and GCC. Will ask staff to inform.   Laroy Apple, MD [place] Resident, PGY-3 05/12/2014, 12:04 PM

## 2014-05-12 NOTE — Telephone Encounter (Signed)
LVM for patient to call back. ?

## 2014-05-21 ENCOUNTER — Telehealth: Payer: Self-pay | Admitting: Family Medicine

## 2014-05-21 NOTE — Telephone Encounter (Signed)
Pt called because she is on new [facility] and was given instructions on how to take them, but now she has forgotten and would like a nurse to call her. jw

## 2014-05-21 NOTE — Telephone Encounter (Signed)
Spoke with patient, re-iterated instructions on current rx for sprintec, patient expressed understanding.

## 2014-06-05 ENCOUNTER — Telehealth: Payer: Self-pay | Admitting: Family Medicine

## 2014-06-05 NOTE — Telephone Encounter (Signed)
Returning pt's call; left voice message for pt to return nurse call.  Derl Barrow, RN

## 2014-06-05 NOTE — Telephone Encounter (Signed)
Ms. Selena Peterson called concerning bc med she's taking and the length of time she's been having her period.

## 2014-06-09 ENCOUNTER — Ambulatory Visit: Payer: Self-pay | Admitting: Cardiology

## 2014-06-10 ENCOUNTER — Encounter (HOSPITAL_COMMUNITY): Payer: Self-pay | Admitting: Emergency Medicine

## 2014-06-10 ENCOUNTER — Emergency Department (INDEPENDENT_AMBULATORY_CARE_PROVIDER_SITE_OTHER)
Admission: EM | Admit: 2014-06-10 | Discharge: 2014-06-10 | Disposition: A | Discharge status: Home / Self Care | Payer: No Typology Code available for payment source | Source: Home / Self Care | Attending: Emergency Medicine | Admitting: Emergency Medicine

## 2014-06-10 ENCOUNTER — Other Ambulatory Visit (HOSPITAL_COMMUNITY)
Admission: RE | Admit: 2014-06-10 | Discharge: 2014-06-10 | Disposition: A | Discharge status: Home / Self Care | Payer: No Typology Code available for payment source | Source: Ambulatory Visit | Attending: Emergency Medicine | Admitting: Emergency Medicine

## 2014-06-10 ENCOUNTER — Ambulatory Visit: Payer: No Typology Code available for payment source | Admitting: Family Medicine

## 2014-06-10 DIAGNOSIS — Z113 Encounter for screening for infections with a predominantly sexual mode of transmission: Secondary | ICD-10-CM | POA: Insufficient documentation

## 2014-06-10 DIAGNOSIS — N760 Acute vaginitis: Secondary | ICD-10-CM | POA: Insufficient documentation

## 2014-06-10 DIAGNOSIS — N920 Excessive and frequent menstruation with regular cycle: Secondary | ICD-10-CM

## 2014-06-10 DIAGNOSIS — N921 Excessive and frequent menstruation with irregular cycle: Secondary | ICD-10-CM

## 2014-06-10 LAB — POCT URINALYSIS DIP (DEVICE)
Bilirubin Urine: NEGATIVE
GLUCOSE, UA: NEGATIVE mg/dL
LEUKOCYTES UA: NEGATIVE
Nitrite: NEGATIVE
Protein, ur: 30 mg/dL — AB
Urobilinogen, UA: 0.2 mg/dL (ref 0.0–1.0)
pH: 5.5 (ref 5.0–8.0)

## 2014-06-10 LAB — POCT PREGNANCY, URINE: PREG TEST UR: NEGATIVE

## 2014-06-10 MED ORDER — MEDROXYPROGESTERONE ACETATE 10 MG PO TABS: 10.0000 mg | ORAL_TABLET | Freq: Every day | ORAL | Status: DC

## 2014-06-10 MED ORDER — DICLOFENAC SODIUM 75 MG PO TBEC: 75.0000 mg | DELAYED_RELEASE_TABLET | Freq: Two times a day (BID) | ORAL | Status: DC

## 2014-06-10 NOTE — ED Notes (Signed)
Pt reports menstrual bleeding onset 3 weeks w/abd cramping Has been on new BC pills x 2 months; going through 6-7 pads/tampons per day Denies urinary sx, vag d/c Alert, no signs of acute distress.

## 2014-06-10 NOTE — Discharge Instructions (Signed)

## 2014-06-10 NOTE — ED Provider Notes (Signed)
Chief Complaint   Chief Complaint  Patient presents with  . Menstrual Problem    History of Present Illness   Selena Peterson is a 23 year old female who presents today with menstrual problems. She had had an Implanad and Depo-Provera. A month and a half ago she started a new birth control pill, Sprintec. She thinks the active pills of the first pack. She started bleeding on day 20. She was told not to take the inner pills, and then just started a second pack on day 22. She took 9 pills of the second pack, but continued to have bleeding. She stopped taking the pills altogether 6 days ago. Bleeding has continued. The initial bleeding was burgundy in color and now it's read. It's about the same as her normal menses. She denies passing any clots or tissue. She has had some cramps, about the same as her normal menstrual cramp. She denies any fever, chills, nausea, vomiting, vaginal discharge, itching, or urinary symptoms.   Review of Systems   Other than as noted above, the patient denies any of the following symptoms: Systemic:  No fever or chills GI:  No abdominal pain, nausea, vomiting, diarrhea, constipation, melena or hematochezia. GU:  No dysuria, frequency, urgency, hematuria, vaginal discharge, itching, or abnormal vaginal bleeding.  [place]   Past medical history, family history, social history, meds, and allergies were reviewed.    Physical Examination    Vital signs:  BP 167/92  Pulse 67  Temp(Src) 98 F (36.7 C) (Oral)  Resp 20  SpO2 100%  LMP 06/10/2014 General:  Alert, oriented and in no distress. Lungs:  Breath sounds clear and equal bilaterally.  No wheezes, rales or rhonchi. Heart:  Regular rhythm.  No gallops or murmers. Abdomen:  Soft, flat and non-distended.  No organomegaly or mass.  No tenderness, guarding or rebound.  Bowel sounds normally active. Pelvic exam:  Normal external genitalia. There was a small amount of blood in the vaginal vault. No discharge. Cervix  appeared normal. No pain on cervical motion. Uterus was normal in size and shape and nontender. No adnexal masses or tenderness.  DNA probes for gonorrhea, Chlamydia, Trichomonas, Gardnerella, Candida were obtained. Skin:  Clear, warm and dry.  Labs   Results for orders placed during the hospital encounter of 06/10/14  POCT PREGNANCY, URINE      Result Value Ref Range   Preg Test, Ur NEGATIVE  NEGATIVE  POCT URINALYSIS DIP (DEVICE)      Result Value Ref Range   Glucose, UA NEGATIVE  NEGATIVE mg/dL   Bilirubin Urine NEGATIVE  NEGATIVE   Ketones, ur TRACE (*) NEGATIVE mg/dL   Specific Gravity, Urine >=1.030  1.005 - 1.030   Hgb urine dipstick MODERATE (*) NEGATIVE   pH 5.5  5.0 - 8.0   Protein, ur 30 (*) NEGATIVE mg/dL   Urobilinogen, UA 0.2  0.0 - 1.0 mg/dL   Nitrite NEGATIVE  NEGATIVE   Leukocytes, UA NEGATIVE  NEGATIVE    Assessment   The encounter diagnosis was Menometrorrhagia.  Probably due to dysfunctional uterine bleeding secondary to birth control pills. I told the patient her options were to stop the pills altogether and use a backup method in the meantime, or to switch to a different pill or use something altogether different like an Implanad, Depo-Provera, or a Mirena. She opted to stop all birth control methods and use a barrier method in the meantime. She'll followup with her primary care physician if symptoms persist.  Plan    1.  Meds:  The following meds were prescribed:   Discharge Medication List as of 06/10/2014  8:05 PM    START taking these medications   Details  diclofenac (VOLTAREN) 75 MG EC tablet Take 1 tablet (75 mg total) by mouth 2 (two) times daily., Starting 06/10/2014, Until Discontinued, Normal    medroxyPROGESTERone (PROVERA) 10 MG tablet Take 1 tablet (10 mg total) by mouth daily., Starting 06/10/2014, Until Discontinued, Normal        2.  Patient Education/Counseling:  The patient was given appropriate handouts, self care instructions, and  instructed in symptomatic relief.    3.  Follow up:  The patient was told to follow up here if no better in 3 to 4 days, or sooner if becoming worse in any way, and given some red flag symptoms such as worsening pain, fever, persistent vomiting, or heavy vaginal bleeding which would prompt immediate return.       Harden Mo, MD 06/10/14 2204

## 2014-08-27 ENCOUNTER — Encounter: Payer: Self-pay | Admitting: Family Medicine

## 2014-08-27 ENCOUNTER — Ambulatory Visit (INDEPENDENT_AMBULATORY_CARE_PROVIDER_SITE_OTHER): Payer: Medicaid Other | Admitting: Family Medicine

## 2014-08-27 ENCOUNTER — Other Ambulatory Visit (HOSPITAL_COMMUNITY)
Admission: RE | Admit: 2014-08-27 | Discharge: 2014-08-27 | Disposition: A | Discharge status: Home / Self Care | Payer: No Typology Code available for payment source | Source: Ambulatory Visit | Attending: Family Medicine | Admitting: Family Medicine

## 2014-08-27 VITALS — BP 134/85 | HR 81 | Temp 98.1°F | Ht 60.0 in | Wt 145.7 lb

## 2014-08-27 DIAGNOSIS — N760 Acute vaginitis: Secondary | ICD-10-CM | POA: Insufficient documentation

## 2014-08-27 DIAGNOSIS — B9689 Other specified bacterial agents as the cause of diseases classified elsewhere: Secondary | ICD-10-CM | POA: Insufficient documentation

## 2014-08-27 DIAGNOSIS — Z113 Encounter for screening for infections with a predominantly sexual mode of transmission: Secondary | ICD-10-CM

## 2014-08-27 DIAGNOSIS — A499 Bacterial infection, unspecified: Secondary | ICD-10-CM

## 2014-08-27 LAB — POCT WET PREP (WET MOUNT): Clue Cells Wet Prep Whiff POC: POSITIVE

## 2014-08-27 MED ORDER — METRONIDAZOLE 500 MG PO TABS: 500.0000 mg | ORAL_TABLET | Freq: Two times a day (BID) | ORAL | Status: DC

## 2014-08-27 NOTE — Assessment & Plan Note (Signed)
Wet prep consistent with BV  Plan: 1. Metronidazole 500mg  BID x 7 days, avoid alcohol use 2. RTC if symptoms worsen or do not improve

## 2014-08-27 NOTE — Patient Instructions (Signed)
Dear Selena Peterson, Thank you for coming in to clinic today.  1. Your wet prep test showed Bacterial Vaginosis (BV) - sent prescription for Metronidazole 500mg  take 1 tablet 2 times daily for 7 days. 2. The results from your STD testing will be printed and ready for pick-up next week. Call if you have questions or to check if ready.  Please schedule a follow-up appointment with Dr. Wendi Snipes to discuss other concerns as needed within 1 month.  If you have any other questions or concerns, please feel free to call the clinic to contact me. You may also schedule an earlier appointment if necessary.  However, if your symptoms get significantly worse, please go to the Emergency Department to seek immediate medical attention.  Selena Peterson, [place]

## 2014-08-27 NOTE — Assessment & Plan Note (Signed)
Requested STD screening today (HIV, RPR, GC/Chlamydia probe) - Last HIV / RPR - non-reactive 05/07/14 - No significant red flag symptoms or concerns for active infection  Plan: 1. Ordered GC/Chlamydia probe - pending results 2. Patient unable to receive HIV / RPR blood draw due to time constraints, decided to leave and will get these tests at future date 3. Requested lab results to be printed and for patient to pick-up when ready

## 2014-08-27 NOTE — Addendum Note (Signed)
Addended by: Olin Hauser on: 08/27/2014 02:21 PM   Modules accepted: Level of Service

## 2014-08-27 NOTE — Progress Notes (Signed)
   Subjective:    Patient ID: Selena Peterson, female    DOB: 10/22/1991, 23 y.o.   MRN: [id number]  Patient presents for a same day appointment.  HPI  STD Screening: - Requesting STD testing, HIV, RPR, GC/Chlam. Currently sexually active with boyfriend, states that he is "germaphobe" and both agree to regular testing, no other partners, no condom use. Per chart review, last STD testing on 05/07/14 (with non-reactive HIV and RPR) - Denies any significant vaginal discharge, odor, itching, rash, fevers/chills, abdominal pain, dysuria  OB: - LMP 08/23/14 (currently on), regular and normal flow - Currently off of OCPs for a while  I have reviewed and updated the following as appropriate: allergies and current medications  Social Hx: - Active smoker  Review of Systems  See above HPI    Objective:   Physical Exam  BP 134/85 mmHg  Pulse 81  Temp(Src) 98.1 F (36.7 C) (Oral)  Ht 5' (1.524 m)  Wt 145 lb 11.2 oz (66.089 kg)  BMI 28.46 kg/m2  LMP 08/23/2014  Gen - well-appearing, NAD HEENT - MMM Abd - soft, NTND Pelvic Exam - Normal external female genitalia. Vaginal canal without lesions. Normal appearing cervix, without lesions. Small amount of physiologic appearing discharge, notable amount of dark appearing menstrual bleeding within vaginal vault. Bimanual exam without masses or cervical motion tenderness.  Pelvic Exam chaperoned by nurse, Delray Alt.     Assessment & Plan:   See specific A&P problem list for details.

## 2014-08-28 ENCOUNTER — Encounter: Payer: Self-pay | Admitting: Family Medicine

## 2014-08-28 LAB — CERVICOVAGINAL ANCILLARY ONLY
CHLAMYDIA, DNA PROBE: NEGATIVE
NEISSERIA GONORRHEA: NEGATIVE

## 2014-10-01 ENCOUNTER — Encounter (HOSPITAL_COMMUNITY): Payer: Self-pay | Admitting: Cardiovascular Disease

## 2014-10-16 ENCOUNTER — Encounter (HOSPITAL_COMMUNITY): Payer: Self-pay

## 2014-10-16 ENCOUNTER — Inpatient Hospital Stay (HOSPITAL_COMMUNITY)
Admission: AD | Admit: 2014-10-16 | Discharge: 2014-10-16 | Disposition: A | Discharge status: Home / Self Care | Payer: Medicaid Other | Source: Ambulatory Visit | Attending: Obstetrics & Gynecology | Admitting: Obstetrics & Gynecology

## 2014-10-16 DIAGNOSIS — Z3201 Encounter for pregnancy test, result positive: Secondary | ICD-10-CM | POA: Insufficient documentation

## 2014-10-16 LAB — URINALYSIS, ROUTINE W REFLEX MICROSCOPIC
BILIRUBIN URINE: NEGATIVE
Glucose, UA: NEGATIVE mg/dL
Ketones, ur: NEGATIVE mg/dL
Leukocytes, UA: NEGATIVE
Nitrite: NEGATIVE
PH: 5.5 (ref 5.0–8.0)
Protein, ur: NEGATIVE mg/dL
UROBILINOGEN UA: 0.2 mg/dL (ref 0.0–1.0)

## 2014-10-16 LAB — URINE MICROSCOPIC-ADD ON

## 2014-10-16 LAB — POCT PREGNANCY, URINE: PREG TEST UR: POSITIVE — AB

## 2014-10-16 NOTE — MAU Note (Addendum)
Patient states she has had a positive home pregnancy test and wants confirmation.Denies pain, bleeding, discharge, nausea or vomiting.

## 2014-12-15 ENCOUNTER — Other Ambulatory Visit: Payer: Self-pay | Admitting: Family Medicine

## 2014-12-15 DIAGNOSIS — N926 Irregular menstruation, unspecified: Secondary | ICD-10-CM

## 2014-12-15 NOTE — Telephone Encounter (Signed)
Needs refill on birth control.  Would like it sent to outpatient pharmacy

## 2015-01-08 ENCOUNTER — Ambulatory Visit (INDEPENDENT_AMBULATORY_CARE_PROVIDER_SITE_OTHER): Payer: Medicaid Other | Admitting: Family Medicine

## 2015-01-08 ENCOUNTER — Encounter: Payer: Self-pay | Admitting: Family Medicine

## 2015-01-08 VITALS — BP 143/78 | HR 74 | Temp 97.9°F | Ht 62.0 in | Wt 152.6 lb

## 2015-01-08 DIAGNOSIS — IMO0001 Reserved for inherently not codable concepts without codable children: Secondary | ICD-10-CM | POA: Insufficient documentation

## 2015-01-08 DIAGNOSIS — R030 Elevated blood-pressure reading, without diagnosis of hypertension: Secondary | ICD-10-CM

## 2015-01-08 DIAGNOSIS — Z30019 Encounter for initial prescription of contraceptives, unspecified: Secondary | ICD-10-CM

## 2015-01-08 DIAGNOSIS — Z3009 Encounter for other general counseling and advice on contraception: Secondary | ICD-10-CM

## 2015-01-08 LAB — POCT URINE PREGNANCY: Preg Test, Ur: NEGATIVE

## 2015-01-08 MED ORDER — ETONOGESTREL 68 MG ~~LOC~~ IMPL
68.0000 mg | DRUG_IMPLANT | Freq: Once | SUBCUTANEOUS | Status: AC
  Administered 2015-01-08: 68 mg via SUBCUTANEOUS

## 2015-01-08 NOTE — Patient Instructions (Signed)
Great to see you again!  You'll need another pap smear in September  Your blood pressure is borderline high, I recommend checking it at teh grocery store or at the drug store, if it is consistently over 140/90 you should consider medicines.   You will have some soreness of the area, if the pain seems unusual, you develop fever in the area or a fever, you have redness, or tenderness of the area  Then please come back in as these are signs of infection.

## 2015-01-08 NOTE — Assessment & Plan Note (Signed)
Blood pressure elevated again today to 143/78 Patient noncompliant with medication, discussed good compliance and follow-up in 4-6 weeks Can consider increasing diltiazem as she is tolerating it well and she would like to take as few medicines as possible. Has history of vasospasm and resulting NSTEMI leading to the need for diltiazem

## 2015-01-08 NOTE — Assessment & Plan Note (Signed)
Patient here with negative urine pregnancy test desiring contraception with Nexplanon placed was placed after informed consent was obtained

## 2015-01-08 NOTE — Progress Notes (Signed)
Patient ID: Selena Peterson, female   DOB: 1991/02/10, 24 y.o.   MRN: [id number]   HPI  Patient presents today for contraception  Patient explains that she would like to have the Nexplanon placed for contraception. She had one removed last year and then pregnant in December 2015. January she had an abortion and has had 2 normal periods Since that time.  LMP - today  Elevated blood pressure without diagnosis of hypertension Blood pressures consistently elevated she comes in, patient is obviously probably hypertensive and she understands this. Taking diltiazem inconsistently, would like to keep to his few medications as possible. The chest pain currently, no dyspnea, no headaches, no leg edema  Smoking status noted- former smoker ROS: Per HPI  Objective: BP 143/78 mmHg  Pulse 74  Temp(Src) 97.9 F (36.6 C) (Oral)  Ht 5\' 2"  (1.575 m)  Wt 152 lb 9.6 oz (69.219 kg)  BMI 27.90 kg/m2  LMP 01/08/2015 (Exact Date)  Breastfeeding? Unknown Gen: NAD, alert, cooperative with exam HEENT: NCAT CV: RRR, good S1/S2, no murmur Resp: CTABL, no wheezes, non-labored Ext: No edema, warm Neuro: Alert and oriented, No gross deficits  Patient given informed consent, signed copy in the chart, time out was performed. Pregnancy test was negative. Appropriate time out taken.  Patient's L arm was prepped in the usual sterile fashion.. The ruler used to measure and mark insertion area.  Pt was prepped with alcohol swab and then injected with 3 cc of 1% lidocaine with epinephrine.  Pt was prepped with betadine, Implanon removed form packaging,  Device confirmed in needle, then inserted full length of needle and withdrawn per handbook instructions.  Pt insertion site covered with steri strip and pressure bandage.   Minimal blood loss.  Pt tolerated the procedure well.    Assessment and plan:  Contraception Patient here with negative urine pregnancy test desiring contraception with Nexplanon placed was placed  after informed consent was obtained   Elevated blood pressure Blood pressure elevated again today to 143/78 Patient noncompliant with medication, discussed good compliance and follow-up in 4-6 weeks Can consider increasing diltiazem as she is tolerating it well and she would like to take as few medicines as possible. Has history of vasospasm and resulting NSTEMI leading to the need for diltiazem     Orders Placed This Encounter  Procedures  . POCT urine pregnancy

## 2015-01-11 NOTE — Progress Notes (Signed)
I was preceptor the day of this visit.   

## 2015-03-24 ENCOUNTER — Ambulatory Visit: Payer: Self-pay | Admitting: Family Medicine

## 2015-04-14 ENCOUNTER — Telehealth: Payer: Self-pay | Admitting: Family Medicine

## 2015-04-14 ENCOUNTER — Encounter: Payer: Self-pay | Admitting: Family Medicine

## 2015-04-14 ENCOUNTER — Other Ambulatory Visit (HOSPITAL_COMMUNITY)
Admission: RE | Admit: 2015-04-14 | Discharge: 2015-04-14 | Disposition: A | Discharge status: Home / Self Care | Payer: Medicaid Other | Source: Ambulatory Visit | Attending: Family Medicine | Admitting: Family Medicine

## 2015-04-14 ENCOUNTER — Ambulatory Visit (INDEPENDENT_AMBULATORY_CARE_PROVIDER_SITE_OTHER): Payer: Self-pay | Admitting: Family Medicine

## 2015-04-14 VITALS — BP 136/88 | HR 87 | Temp 98.3°F | Ht 62.0 in | Wt 149.7 lb

## 2015-04-14 DIAGNOSIS — N898 Other specified noninflammatory disorders of vagina: Secondary | ICD-10-CM

## 2015-04-14 DIAGNOSIS — IMO0001 Reserved for inherently not codable concepts without codable children: Secondary | ICD-10-CM

## 2015-04-14 DIAGNOSIS — I214 Non-ST elevation (NSTEMI) myocardial infarction: Secondary | ICD-10-CM

## 2015-04-14 DIAGNOSIS — F39 Unspecified mood [affective] disorder: Secondary | ICD-10-CM

## 2015-04-14 DIAGNOSIS — Z3040 Encounter for surveillance of contraceptives, unspecified: Secondary | ICD-10-CM

## 2015-04-14 DIAGNOSIS — L299 Pruritus, unspecified: Secondary | ICD-10-CM

## 2015-04-14 DIAGNOSIS — Z01419 Encounter for gynecological examination (general) (routine) without abnormal findings: Secondary | ICD-10-CM | POA: Insufficient documentation

## 2015-04-14 DIAGNOSIS — R030 Elevated blood-pressure reading, without diagnosis of hypertension: Secondary | ICD-10-CM

## 2015-04-14 DIAGNOSIS — R002 Palpitations: Secondary | ICD-10-CM

## 2015-04-14 DIAGNOSIS — Z124 Encounter for screening for malignant neoplasm of cervix: Secondary | ICD-10-CM

## 2015-04-14 DIAGNOSIS — N938 Other specified abnormal uterine and vaginal bleeding: Secondary | ICD-10-CM

## 2015-04-14 DIAGNOSIS — L298 Other pruritus: Secondary | ICD-10-CM

## 2015-04-14 LAB — CBC WITH DIFFERENTIAL/PLATELET
BASOS ABS: 0 10*3/uL (ref 0.0–0.1)
Basophils Relative: 0 % (ref 0–1)
Eosinophils Absolute: 0.1 10*3/uL (ref 0.0–0.7)
Eosinophils Relative: 1 % (ref 0–5)
HEMATOCRIT: 44.6 % (ref 36.0–46.0)
HEMOGLOBIN: 14.7 g/dL (ref 12.0–15.0)
LYMPHS ABS: 2.9 10*3/uL (ref 0.7–4.0)
LYMPHS PCT: 31 % (ref 12–46)
MCH: 29.3 pg (ref 26.0–34.0)
MCHC: 33 g/dL (ref 30.0–36.0)
MCV: 89 fL (ref 78.0–100.0)
MONO ABS: 0.8 10*3/uL (ref 0.1–1.0)
MPV: 10.3 fL (ref 8.6–12.4)
Monocytes Relative: 8 % (ref 3–12)
Neutro Abs: 5.6 10*3/uL (ref 1.7–7.7)
Neutrophils Relative %: 60 % (ref 43–77)
Platelets: 361 10*3/uL (ref 150–400)
RBC: 5.01 MIL/uL (ref 3.87–5.11)
RDW: 13.2 % (ref 11.5–15.5)
WBC: 9.4 10*3/uL (ref 4.0–10.5)

## 2015-04-14 LAB — COMPREHENSIVE METABOLIC PANEL
ALT: 11 U/L (ref 0–35)
AST: 12 U/L (ref 0–37)
Albumin: 4.5 g/dL (ref 3.5–5.2)
Alkaline Phosphatase: 72 U/L (ref 39–117)
BILIRUBIN TOTAL: 0.5 mg/dL (ref 0.2–1.2)
BUN: 8 mg/dL (ref 6–23)
CO2: 25 mEq/L (ref 19–32)
Calcium: 9.8 mg/dL (ref 8.4–10.5)
Chloride: 104 mEq/L (ref 96–112)
Creat: 0.55 mg/dL (ref 0.50–1.10)
GLUCOSE: 88 mg/dL (ref 70–99)
Potassium: 4.5 mEq/L (ref 3.5–5.3)
Sodium: 138 mEq/L (ref 135–145)
Total Protein: 7.7 g/dL (ref 6.0–8.3)

## 2015-04-14 LAB — POCT URINE PREGNANCY: Preg Test, Ur: NEGATIVE

## 2015-04-14 LAB — POCT WET PREP (WET MOUNT): Clue Cells Wet Prep Whiff POC: NEGATIVE

## 2015-04-14 LAB — TSH: TSH: 0.84 u[IU]/mL (ref 0.350–4.500)

## 2015-04-14 MED ORDER — AMITRIPTYLINE HCL 25 MG PO TABS: 25.0000 mg | ORAL_TABLET | Freq: Every day | ORAL | Status: DC

## 2015-04-14 MED ORDER — FLUCONAZOLE 150 MG PO TABS: 150.0000 mg | ORAL_TABLET | Freq: Once | ORAL | Status: DC

## 2015-04-14 NOTE — Patient Instructions (Addendum)
Great to see you!  Come back in 4 weeks  Cardiology will call you so try to get your meeting with Selena Peterson!   You had an NSTEMI (heart attack shown by blood work and symptoms) last year that was due to vasospasm. This can also be called ACS or acute coronary syndrome.   Acute Coronary Syndrome Acute coronary syndrome (ACS) is an urgent problem in which the blood and oxygen supply to the heart is critically deficient. ACS requires hospitalization because one or more coronary arteries may be blocked. ACS represents a range of conditions including:  Previous angina that is now unstable, lasts longer, happens at rest, or is more intense.  A heart attack, with heart muscle cell injury and death. There are three vital coronary arteries that supply the heart muscle with blood and oxygen so that it can pump blood effectively. If blockages to these arteries develop, blood flow to the heart muscle is reduced. If the heart does not get enough blood, angina may occur as the first warning sign. SYMPTOMS   The most common signs of angina include:  Tightness or squeezing in the chest.  Feeling of heaviness on the chest.  Discomfort in the arms, neck, back, or jaw.  Shortness of breath and nausea.  Cold, wet skin.  Angina is usually brought on by physical effort or excitement which increase the oxygen needs of the heart. These states increase the blood flow needs of the heart beyond what can be delivered.  Other symptoms that are not as common include:  Fatigue  Unexplained feelings of nervousness or anxiety  Weakness  Diarrhea  Sometimes, you may not have noticed any symptoms at all but still suffered a cardiac injury. TREATMENT   Medicines to help discomfort may include nitroglycerin (nitro) in the form of tablets or a spray for rapid relief, or longer-acting forms such as cream, patches, or capsules. (Be aware that there are many side effects and possible interactions with other  drugs).  Other medicines may be used to help the heart pump better.  Procedures to open blocked arteries including angioplasty or stent placement to keep the arteries open.  Open heart surgery may be needed when there are many blockages or they are in critical locations that are best treated with surgery. HOME CARE INSTRUCTIONS   Do not use any tobacco products including cigarettes, chewing tobacco, or electronic cigarettes.  Take one baby or adult aspirin daily, if your health care provider advises. This helps reduce the risk of a heart attack.  It is very important that you follow the angina treatment prescribed by your health care provider. Make arrangements for proper follow-up care.  Eat a heart healthy diet with salt and fat restrictions as advised.  Regular exercise is good for you as long as it does not cause discomfort. Do not begin any new type of exercise until you check with your health care provider.  If you are overweight, you should lose weight.  Try to maintain normal blood lipid levels.  Keep your blood pressure under control as recommended by your health care provider.  You should tell your health care provider right away about any increase in the severity or frequency of your chest discomfort or angina attacks. When you have angina, you should stop what you are doing and sit down. This may bring relief in 3 to 5 minutes. If your health care provider has prescribed nitro, take it as directed.  If your health care provider has given you  a follow-up appointment, it is very important to keep that appointment. Not keeping the appointment could result in a chronic or permanent injury, pain, and disability. If there is any problem keeping the appointment, you must call back to this facility for assistance. SEEK IMMEDIATE MEDICAL CARE IF:   You develop nausea, vomiting, or shortness of breath.  You feel faint, lightheaded, or pass out.  Your chest discomfort gets  worse.  You are sweating or experience sudden profound fatigue.  You do not get relief of your chest pain after 3 doses of nitro.  Your discomfort lasts longer than 15 minutes. MAKE SURE YOU:   Understand these instructions.  Will watch your condition.  Will get help right away if you are not doing well or get worse.  Take all medicines as directed by your health care provider. Document Released: 10/09/2005 Document Revised: 10/14/2013 Document Reviewed: 02/10/2014 [facility] Patient Information 2015 [place], [place]. This information is not intended to replace advice given to you by your health care provider. Make sure you discuss any questions you have with your health care provider.

## 2015-04-14 NOTE — Assessment & Plan Note (Signed)
Now with nexplanon, some spotting Encouraged to wait, expect it to resolve in teh next 2 months

## 2015-04-14 NOTE — Assessment & Plan Note (Signed)
Reports cessation

## 2015-04-14 NOTE — Assessment & Plan Note (Signed)
Primarily anxiety Screen negative for BPD Starting elavil considering pain, sleep diruption, and anxiety PHQ-9 score 4 and no SI GAD-7 score 9, somewhat difficult MDQ negative F/u 4 weeks

## 2015-04-14 NOTE — Assessment & Plan Note (Signed)
reasonable today, not Diagnostic

## 2015-04-14 NOTE — Telephone Encounter (Signed)
Called to let her know wet prep shows yeast, Treat with diflucan.   Laroy Apple, MD [place] Resident, PGY-3 04/14/2015, 3:00 PM

## 2015-04-14 NOTE — Assessment & Plan Note (Signed)
Declined screening today, pap done Wet prep

## 2015-04-14 NOTE — Assessment & Plan Note (Addendum)
Previous NSTEMI, no chest pain but + palpitations likely due to mood.  Thought to be due to vasospasm, Had elevated trops Labs, monitor HTN cloesly Refer to cardiology considering her Hx

## 2015-04-14 NOTE — Progress Notes (Signed)
Patient ID: Selena Peterson, female   DOB: [birth year], 25 y.o.   MRN: [id number]   HPI  Patient presents today for comprehensive physical exam  Irregular menstral bleeding Started after nexplanon insertion 3 months ago, Recently stopped bleeding but reports regular periods with more than 1/2 days of month with spotting No vaginal dc, some itching with concern for BV Same partner for 6+ months No concern for STI  R sided pain and anxiety States she has R sided pain, From shoulder to R foot electric type pain in waves that happen several nights a week when her mood is anxious Ativan didn't help Not taking celexa anymore Poor sleep in general No fam Hx   Palpitations Denies Chest pain, but does have episodes of palpitations with dizziness and dyspnea. She is suspicious this is due to anxiety so she has not followed up with me or cards.  No chest pain, not exertional None today  PMH: Smoking status noted - former ROS: Per HPI  Objective: BP 136/88 mmHg  Pulse 87  Temp(Src) 98.3 F (36.8 C) (Oral)  Ht 5\' 2"  (1.575 m)  Wt 149 lb 11.2 oz (67.903 kg)  BMI 27.37 kg/m2  LMP 04/05/2015 Gen: NAD, alert, cooperative with exam HEENT: NCAT, EOMI, PERRL Neck: thick neck, possible thyromegaly CV: RRR, good S1/S2, no murmur Resp: CTABL, no wheezes, non-labored Abd: SNTND, BS present, no guarding or organomegaly Ext: No edema, warm Neuro: Alert and oriented, No gross deficits Pelvic- chaperoned by female CMA, normal appearing perineum and cervix, no CMT or adnexal fullness, scant amount of thick white cervical dc Skin: No rash  Assessment and plan:  Contraception Now with nexplanon, some spotting Encouraged to wait, expect it to resolve in teh next 2 months  Elevated blood pressure reasonable today, not Diagnostic  NSTEMI (non-ST elevated myocardial infarction) Previous NSTEMI, no chest pain but + palpitations likely due to mood.  Thought to be due to vasospasm, Had elevated  trops Labs, monitor HTN cloesly Refer to cardiology considering her Hx  Screening for STD (sexually transmitted disease) Declined screening today, pap done Wet prep  TOBACCO USER Reports cessation  Mood disorder Primarily anxiety Screen negative for BPD Starting elavil considering pain, sleep diruption, and anxiety PHQ-9 score 4 and no SI GAD-7 score 9, somewhat difficult MDQ negative F/u 4 weeks    Orders Placed This Encounter  Procedures  . Comprehensive metabolic panel  . TSH  . CBC with Differential  . Ambulatory referral to Cardiology    Referral Priority:  Routine    Referral Type:  Consultation    Referral Reason:  Specialty Services Required    Requested Specialty:  Cardiology    Number of Visits Requested:  1  . POCT urine pregnancy  . POCT Wet Prep [facility])

## 2015-04-15 LAB — CYTOLOGY - PAP

## 2015-04-16 ENCOUNTER — Encounter: Payer: Self-pay | Admitting: Family Medicine

## 2015-04-29 IMAGING — CT CT ANGIO CHEST
1 of 9 series · 1 of 36 positions shown · IV contrast (Iodine)
Comparison: Chest radiographs 6169 hr the same day.

CLINICAL DATA: 23-year-old female with headache, chest pain.
Initial encounter.

EXAM:
CT ANGIOGRAPHY CHEST WITH CONTRAST
TECHNIQUE: Multidetector CT imaging of the chest was performed using the
standard protocol during bolus administration of intravenous
contrast. Multiplanar CT image reconstructions and MIPs were
obtained to evaluate the vascular anatomy.
CONTRAST:  100mL OMNIPAQUE IOHEXOL 350 MG/ML SOLN

[Series 200: locator · axial · 0.49mm/px · 1 of 1 slices shown]
[im 1/1  lung]
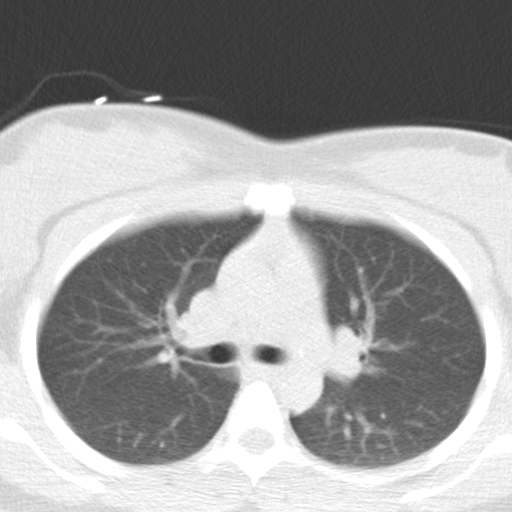

[1 of 36 positions shown; findings below may reference images not displayed]

FINDINGS: Good contrast bolus timing in the pulmonary arterial tree. Motion
artifact at the lung bases. No focal filling defect identified in
the pulmonary arterial tree to suggest the presence of acute
pulmonary embolism.

Major airways are patent. The lungs are clear except for minimal
dependent atelectasis. No pericardial or pleural effusion. Negative
thoracic inlet. Visualized aorta is normal. No mediastinal or
axillary lymphadenopathy. Negative visualized upper abdominal
viscera.

No acute osseous abnormality identified.

Review of the MIP images confirms the above findings.
IMPRESSION: No evidence of acute pulmonary embolus.  Negative chest CTA.

## 2015-05-02 ENCOUNTER — Emergency Department (HOSPITAL_COMMUNITY)
Admission: EM | Admit: 2015-05-02 | Discharge: 2015-05-02 | Disposition: A | Discharge status: Home / Self Care | Payer: Medicaid Other | Attending: Emergency Medicine | Admitting: Emergency Medicine

## 2015-05-02 ENCOUNTER — Emergency Department (HOSPITAL_COMMUNITY): Payer: Medicaid Other

## 2015-05-02 ENCOUNTER — Encounter (HOSPITAL_COMMUNITY): Payer: Self-pay

## 2015-05-02 DIAGNOSIS — R509 Fever, unspecified: Secondary | ICD-10-CM | POA: Insufficient documentation

## 2015-05-02 DIAGNOSIS — Z87891 Personal history of nicotine dependence: Secondary | ICD-10-CM | POA: Insufficient documentation

## 2015-05-02 DIAGNOSIS — F419 Anxiety disorder, unspecified: Secondary | ICD-10-CM | POA: Insufficient documentation

## 2015-05-02 DIAGNOSIS — R079 Chest pain, unspecified: Secondary | ICD-10-CM | POA: Insufficient documentation

## 2015-05-02 DIAGNOSIS — M549 Dorsalgia, unspecified: Secondary | ICD-10-CM | POA: Insufficient documentation

## 2015-05-02 DIAGNOSIS — R112 Nausea with vomiting, unspecified: Secondary | ICD-10-CM | POA: Insufficient documentation

## 2015-05-02 DIAGNOSIS — Z8619 Personal history of other infectious and parasitic diseases: Secondary | ICD-10-CM | POA: Insufficient documentation

## 2015-05-02 DIAGNOSIS — Z79899 Other long term (current) drug therapy: Secondary | ICD-10-CM | POA: Insufficient documentation

## 2015-05-02 DIAGNOSIS — R1012 Left upper quadrant pain: Secondary | ICD-10-CM | POA: Insufficient documentation

## 2015-05-02 DIAGNOSIS — Z7982 Long term (current) use of aspirin: Secondary | ICD-10-CM | POA: Insufficient documentation

## 2015-05-02 DIAGNOSIS — Z3202 Encounter for pregnancy test, result negative: Secondary | ICD-10-CM | POA: Insufficient documentation

## 2015-05-02 DIAGNOSIS — R350 Frequency of micturition: Secondary | ICD-10-CM | POA: Insufficient documentation

## 2015-05-02 DIAGNOSIS — R1010 Upper abdominal pain, unspecified: Secondary | ICD-10-CM

## 2015-05-02 DIAGNOSIS — Z9889 Other specified postprocedural states: Secondary | ICD-10-CM | POA: Insufficient documentation

## 2015-05-02 DIAGNOSIS — I252 Old myocardial infarction: Secondary | ICD-10-CM | POA: Insufficient documentation

## 2015-05-02 LAB — HEPATIC FUNCTION PANEL
ALK PHOS: 84 U/L (ref 38–126)
ALT: 19 U/L (ref 14–54)
AST: 29 U/L (ref 15–41)
Albumin: 3.7 g/dL (ref 3.5–5.0)
BILIRUBIN INDIRECT: 0.9 mg/dL (ref 0.3–0.9)
BILIRUBIN TOTAL: 1.4 mg/dL — AB (ref 0.3–1.2)
Bilirubin, Direct: 0.5 mg/dL (ref 0.1–0.5)
Total Protein: 7.6 g/dL (ref 6.5–8.1)

## 2015-05-02 LAB — BASIC METABOLIC PANEL
Anion gap: 9 (ref 5–15)
BUN: 5 mg/dL — ABNORMAL LOW (ref 6–20)
CALCIUM: 8.9 mg/dL (ref 8.9–10.3)
CO2: 22 mmol/L (ref 22–32)
Chloride: 103 mmol/L (ref 101–111)
Creatinine, Ser: 0.58 mg/dL (ref 0.44–1.00)
GFR calc Af Amer: >60 mL/min (ref >60)
GLUCOSE: 98 mg/dL (ref 65–99)
POTASSIUM: 4.6 mmol/L (ref 3.5–5.1)
Sodium: 134 mmol/L — ABNORMAL LOW (ref 135–145)

## 2015-05-02 LAB — URINALYSIS, ROUTINE W REFLEX MICROSCOPIC
Bilirubin Urine: NEGATIVE
Glucose, UA: NEGATIVE mg/dL
HGB URINE DIPSTICK: NEGATIVE
Ketones, ur: NEGATIVE mg/dL
NITRITE: NEGATIVE
Protein, ur: NEGATIVE mg/dL
Specific Gravity, Urine: 1.012 (ref 1.005–1.030)
Urobilinogen, UA: 0.2 mg/dL (ref 0.0–1.0)
pH: 6 (ref 5.0–8.0)

## 2015-05-02 LAB — CBC
HCT: 43.2 % (ref 36.0–46.0)
Hemoglobin: 14.7 g/dL (ref 12.0–15.0)
MCH: 29.9 pg (ref 26.0–34.0)
MCHC: 34 g/dL (ref 30.0–36.0)
MCV: 87.8 fL (ref 78.0–100.0)
Platelets: 197 10*3/uL (ref 150–400)
RBC: 4.92 MIL/uL (ref 3.87–5.11)
RDW: 13.3 % (ref 11.5–15.5)
WBC: 8.7 10*3/uL (ref 4.0–10.5)

## 2015-05-02 LAB — URINE MICROSCOPIC-ADD ON

## 2015-05-02 LAB — POC URINE PREG, ED: PREG TEST UR: NEGATIVE

## 2015-05-02 LAB — I-STAT TROPONIN, ED: TROPONIN I, POC: 0 ng/mL (ref 0.00–0.08)

## 2015-05-02 LAB — LIPASE, BLOOD: LIPASE: 17 U/L — AB (ref 22–51)

## 2015-05-02 MED ORDER — ONDANSETRON 4 MG PO TBDP: ORAL_TABLET | ORAL | Status: DC

## 2015-05-02 MED ORDER — SODIUM CHLORIDE 0.9 % IV BOLUS (SEPSIS)
1000.0000 mL | Freq: Once | INTRAVENOUS | Status: AC
  Administered 2015-05-02: 1000 mL via INTRAVENOUS

## 2015-05-02 MED ORDER — GI COCKTAIL ~~LOC~~
30.0000 mL | Freq: Once | ORAL | Status: AC
  Administered 2015-05-02: 30 mL via ORAL
  Filled 2015-05-02: qty 30

## 2015-05-02 MED ORDER — PANTOPRAZOLE SODIUM 40 MG PO TBEC: 40.0000 mg | DELAYED_RELEASE_TABLET | Freq: Two times a day (BID) | ORAL | Status: DC

## 2015-05-02 NOTE — ED Provider Notes (Signed)
CSN: [id number]     Arrival date & time 05/02/15  1103 History   First MD Initiated Contact with Patient 05/02/15 1111     Chief Complaint  Patient presents with  . Abdominal Pain  . Chest Pain     (Consider location/radiation/quality/duration/timing/severity/associated sxs/prior Treatment) HPI  24 year old female presents with intermittent abdominal pain. Last night while she was at work she started having periumbilical sharp abdominal pain. Since then she has vomited 3 times. She had one "abnormal bowel movement" but not diarrhea. Woke up with a fever of 101 this morning that then broke on its own. Since then his been having the intermittent abdominal pain and occasionally it radiates up into her mid chest. Does not feel like the time she was diagnosed with a myocardial infarction last year. At that time she had an elevated troponin but in negative cath. The patient denies any current shortness of breath cough. There is no current chest pain. This pain feels similar to prior "stomach bugs" there were helped with GI cocktail and she is requesting this. She denies any current nausea. No vaginal symptoms. No dysuria but has urinated more frequently this morning than typical.  Past Medical History  Diagnosis Date  . Chlamydia   . BV (bacterial vaginosis)   . Anxiety   . MI (myocardial infarction)   . Elevated troponin 03/19/2014   Past Surgical History  Procedure Laterality Date  . Ankle fracture surgery    . Cardiac catheterization    . Left heart catheterization with coronary angiogram N/A 03/20/2014    Procedure: LEFT HEART CATHETERIZATION WITH CORONARY ANGIOGRAM;  Surgeon: Burnell Blanks, MD;  Location: [facility] CATH LAB;  Service: Cardiovascular;  Laterality: N/A;  . Wisdom tooth extraction     Family History  Problem Relation Age of Onset  . Diabetes Maternal Grandmother   . Hypertension Maternal Grandmother   . GER disease Mother   . Heart failure Maternal Grandmother     History  Substance Use Topics  . Smoking status: Former Smoker -- 0.30 packs/day for 4 years    Types: Cigarettes    Quit date: 09/16/2014  . Smokeless tobacco: Not on file  . Alcohol Use: Yes     Comment: Drinks 1 pint of liquor per week   OB History    Gravida Para Term Preterm AB TAB SAB Ectopic Multiple Living   1              Review of Systems  Constitutional: Positive for fever.  Respiratory: Negative for cough and shortness of breath.   Cardiovascular: Positive for chest pain.  Gastrointestinal: Positive for nausea, vomiting and abdominal pain. Negative for diarrhea.  Genitourinary: Positive for frequency. Negative for dysuria, vaginal bleeding and vaginal discharge.  Musculoskeletal: Positive for back pain.  All other systems reviewed and are negative.     Allergies  Review of patient's allergies indicates no known allergies.  Home Medications   Prior to Admission medications   Medication Sig Start Date End Date Taking? Authorizing Provider  amitriptyline (ELAVIL) 25 MG tablet Take 1 tablet (25 mg total) by mouth at bedtime. 04/14/15   Timmothy Euler, MD  aspirin 81 MG tablet Take 81 mg by mouth daily.    Historical Provider, MD  diclofenac (VOLTAREN) 75 MG EC tablet Take 1 tablet (75 mg total) by mouth 2 (two) times daily. Patient not taking: Reported on 04/14/2015 06/10/14   Harden Mo, MD  diltiazem (CARDIZEM CD) 120 MG 24 hr  capsule Take 1 capsule (120 mg total) by mouth daily. 03/21/14   Eileen Stanford, PA-C  fluconazole (DIFLUCAN) 150 MG tablet Take 1 tablet (150 mg total) by mouth once. And repeat in 3 days 04/14/15   Timmothy Euler, MD   BP 131/83 mmHg  Pulse 97  Temp(Src) 98 F (36.7 C) (Oral)  Resp 21  Ht 5' (1.524 m)  Wt 149 lb (67.586 kg)  BMI 29.10 kg/m2  SpO2 98%  LMP 04/05/2015 Physical Exam  Constitutional: She is oriented to person, place, and time. She appears well-developed and well-nourished. No distress.  HENT:  Head:  Normocephalic and atraumatic.  Right Ear: External ear normal.  Left Ear: External ear normal.  Nose: Nose normal.  Eyes: Right eye exhibits no discharge. Left eye exhibits no discharge.  Cardiovascular: Normal rate, regular rhythm and normal heart sounds.   Pulmonary/Chest: Effort normal and breath sounds normal.  Abdominal: Soft. There is tenderness in the left upper quadrant.  Neurological: She is alert and oriented to person, place, and time.  Skin: Skin is warm and dry. She is not diaphoretic.  Nursing note and vitals reviewed.   ED Course  Procedures (including critical care time) Labs Review Labs Reviewed  BASIC METABOLIC PANEL - Abnormal; Notable for the following:    Sodium 134 (*)    BUN 5 (*)    All other components within normal limits  HEPATIC FUNCTION PANEL - Abnormal; Notable for the following:    Total Bilirubin 1.4 (*)    All other components within normal limits  LIPASE, BLOOD - Abnormal; Notable for the following:    Lipase 17 (*)    All other components within normal limits  URINALYSIS, ROUTINE W REFLEX MICROSCOPIC (NOT AT [facility]) - Abnormal; Notable for the following:    Leukocytes, UA TRACE (*)    All other components within normal limits  CBC  URINE MICROSCOPIC-ADD ON  Randolm Idol, ED  POC URINE PREG, ED    Imaging Review Dg Chest Port 1 View  05/02/2015   CLINICAL DATA:  Sharp chest and epigastric pain beginning yesterday. nausea and vomiting. Prior non STEMI.  EXAM: PORTABLE CHEST - 1 VIEW  COMPARISON:  03/27/2014  FINDINGS: The heart size and mediastinal contours are within normal limits. Both lungs are clear. The visualized skeletal structures are unremarkable.  IMPRESSION: No active disease.   Electronically Signed   By: Earle Gell M.D.   On: 05/02/2015 12:46     EKG Interpretation   Date/Time:  Sunday May 02 2015 11:15:27 EDT Ventricular Rate:  94 PR Interval:  155 QRS Duration: 73 QT Interval:  333 QTC Calculation: 416 R Axis:    41 Text Interpretation:  Normal sinus rhythm rate is slower compared to June  2015 Otherwise no significant change Confirmed by Asmi Fugere  MD, Jermisha Hoffart  ([id number]) on 05/02/2015 11:17:49 AM      MDM   Final diagnoses:  Upper abdominal pain    Patient with mild upper abdominal pain, nausea, and vomiting consistent with likely gastroenteritis. On further evaluation she does her member eating new food yesterday that she thinks could've caused this. Currently she has not had any vomiting and her pain is much better. It has occasionally radiate up into her chest and she does have a history of an elevated troponin with a negative cath, I have very low suspicion this is cardiac or chest related. Patient was hydrated with IV fluids given her well appearance and benign abdominal exam I  feel that no further workup is indicated. Will discharge home with a PPI as well as Zofran as needed for nausea. Recommend follow-up with PCP as needed and sooner if symptoms do not improve.    Sherwood Gambler, MD 05/02/15 [phone]

## 2015-05-02 NOTE — Discharge Instructions (Signed)
Abdominal Pain Many things can cause abdominal pain. Usually, abdominal pain is not caused by a disease and will improve without treatment. It can often be observed and treated at home. Your health care provider will do a physical exam and possibly order blood tests and X-rays to help determine the seriousness of your pain. However, in many cases, more time must pass before a clear cause of the pain can be found. Before that point, your health care provider may not know if you need more testing or further treatment. HOME CARE INSTRUCTIONS  Monitor your abdominal pain for any changes. The following actions may help to alleviate any discomfort you are experiencing:  Only take over-the-counter or prescription medicines as directed by your health care provider.  Do not take laxatives unless directed to do so by your health care provider.  Try a clear liquid diet (broth, tea, or water) as directed by your health care provider. Slowly move to a bland diet as tolerated. SEEK MEDICAL CARE IF:  You have unexplained abdominal pain.  You have abdominal pain associated with nausea or diarrhea.  You have pain when you urinate or have a bowel movement.  You experience abdominal pain that wakes you in the night.  You have abdominal pain that is worsened or improved by eating food.  You have abdominal pain that is worsened with eating fatty foods.  You have a fever. SEEK IMMEDIATE MEDICAL CARE IF:   Your pain does not go away within 2 hours.  You keep throwing up (vomiting).  Your pain is felt only in portions of the abdomen, such as the right side or the left lower portion of the abdomen.  You pass bloody or black tarry stools. MAKE SURE YOU:  Understand these instructions.   Will watch your condition.   Will get help right away if you are not doing well or get worse.  Document Released: 07/19/2005 Document Revised: 10/14/2013 Document Reviewed: 06/18/2013 [facility] Patient Information  2015 [place], [place]. This information is not intended to replace advice given to you by your health care provider. Make sure you discuss any questions you have with your health care provider.    Viral Gastroenteritis Viral gastroenteritis is also known as stomach flu. This condition affects the stomach and intestinal tract. It can cause sudden diarrhea and vomiting. The illness typically lasts 3 to 8 days. Most people develop an immune response that eventually gets rid of the virus. While this natural response develops, the virus can make you quite ill. CAUSES  Many different viruses can cause gastroenteritis, such as rotavirus or noroviruses. You can catch one of these viruses by consuming contaminated food or water. You may also catch a virus by sharing utensils or other personal items with an infected person or by touching a contaminated surface. SYMPTOMS  The most common symptoms are diarrhea and vomiting. These problems can cause a severe loss of body fluids (dehydration) and a body salt (electrolyte) imbalance. Other symptoms may include:  Fever.  Headache.  Fatigue.  Abdominal pain. DIAGNOSIS  Your caregiver can usually diagnose viral gastroenteritis based on your symptoms and a physical exam. A stool sample may also be taken to test for the presence of viruses or other infections. TREATMENT  This illness typically goes away on its own. Treatments are aimed at rehydration. The most serious cases of viral gastroenteritis involve vomiting so severely that you are not able to keep fluids down. In these cases, fluids must be given through an intravenous line (  IV). HOME CARE INSTRUCTIONS   Drink enough fluids to keep your urine clear or pale yellow. Drink small amounts of fluids frequently and increase the amounts as tolerated.  Ask your caregiver for specific rehydration instructions.  Avoid:  Foods high in sugar.  Alcohol.  Carbonated drinks.  Tobacco.  Juice.  Caffeine  drinks.  Extremely hot or cold fluids.  Fatty, greasy foods.  Too much intake of anything at one time.  Dairy products until 24 to 48 hours after diarrhea stops.  You may consume probiotics. Probiotics are active cultures of beneficial bacteria. They may lessen the amount and number of diarrheal stools in adults. Probiotics can be found in yogurt with active cultures and in supplements.  Wash your hands well to avoid spreading the virus.  Only take over-the-counter or prescription medicines for pain, discomfort, or fever as directed by your caregiver. Do not give aspirin to children. Antidiarrheal medicines are not recommended.  Ask your caregiver if you should continue to take your regular prescribed and over-the-counter medicines.  Keep all follow-up appointments as directed by your caregiver. SEEK IMMEDIATE MEDICAL CARE IF:   You are unable to keep fluids down.  You do not urinate at least once every 6 to 8 hours.  You develop shortness of breath.  You notice blood in your stool or vomit. This may look like coffee grounds.  You have abdominal pain that increases or is concentrated in one small area (localized).  You have persistent vomiting or diarrhea.  You have a fever.  The patient is a child younger than 3 months, and he or she has a fever.  The patient is a child older than 3 months, and he or she has a fever and persistent symptoms.  The patient is a child older than 3 months, and he or she has a fever and symptoms suddenly get worse.  The patient is a baby, and he or she has no tears when crying. MAKE SURE YOU:   Understand these instructions.  Will watch your condition.  Will get help right away if you are not doing well or get worse. Document Released: 10/09/2005 Document Revised: 01/01/2012 Document Reviewed: 07/26/2011 [facility] Patient Information 2015 [place], [place]. This information is not intended to replace advice given to you by your health care  provider. Make sure you discuss any questions you have with your health care provider.

## 2015-05-02 NOTE — ED Notes (Signed)
MD at the bedside  

## 2015-05-02 NOTE — ED Notes (Signed)
Per Patient, Patient started to have sharp stomach pains yesterday at work. She reports have some nausea and vomiting. When patient went home, she laid down to rest and started to have pains radiating up in her central chest. Patient reports being woken up in her sleep with episodes of being "unable to breath." Patient has HX of NStemi. Denies diarrhea. Reports Back pain.

## 2015-05-07 IMAGING — CR DG CHEST 2V
2 series · 2 of 2 positions shown · non-contrast
Comparison: Two view chest dated 03/23/2014

CLINICAL DATA: chest pain

EXAM:
CHEST  2 VIEW

[w chest pa]
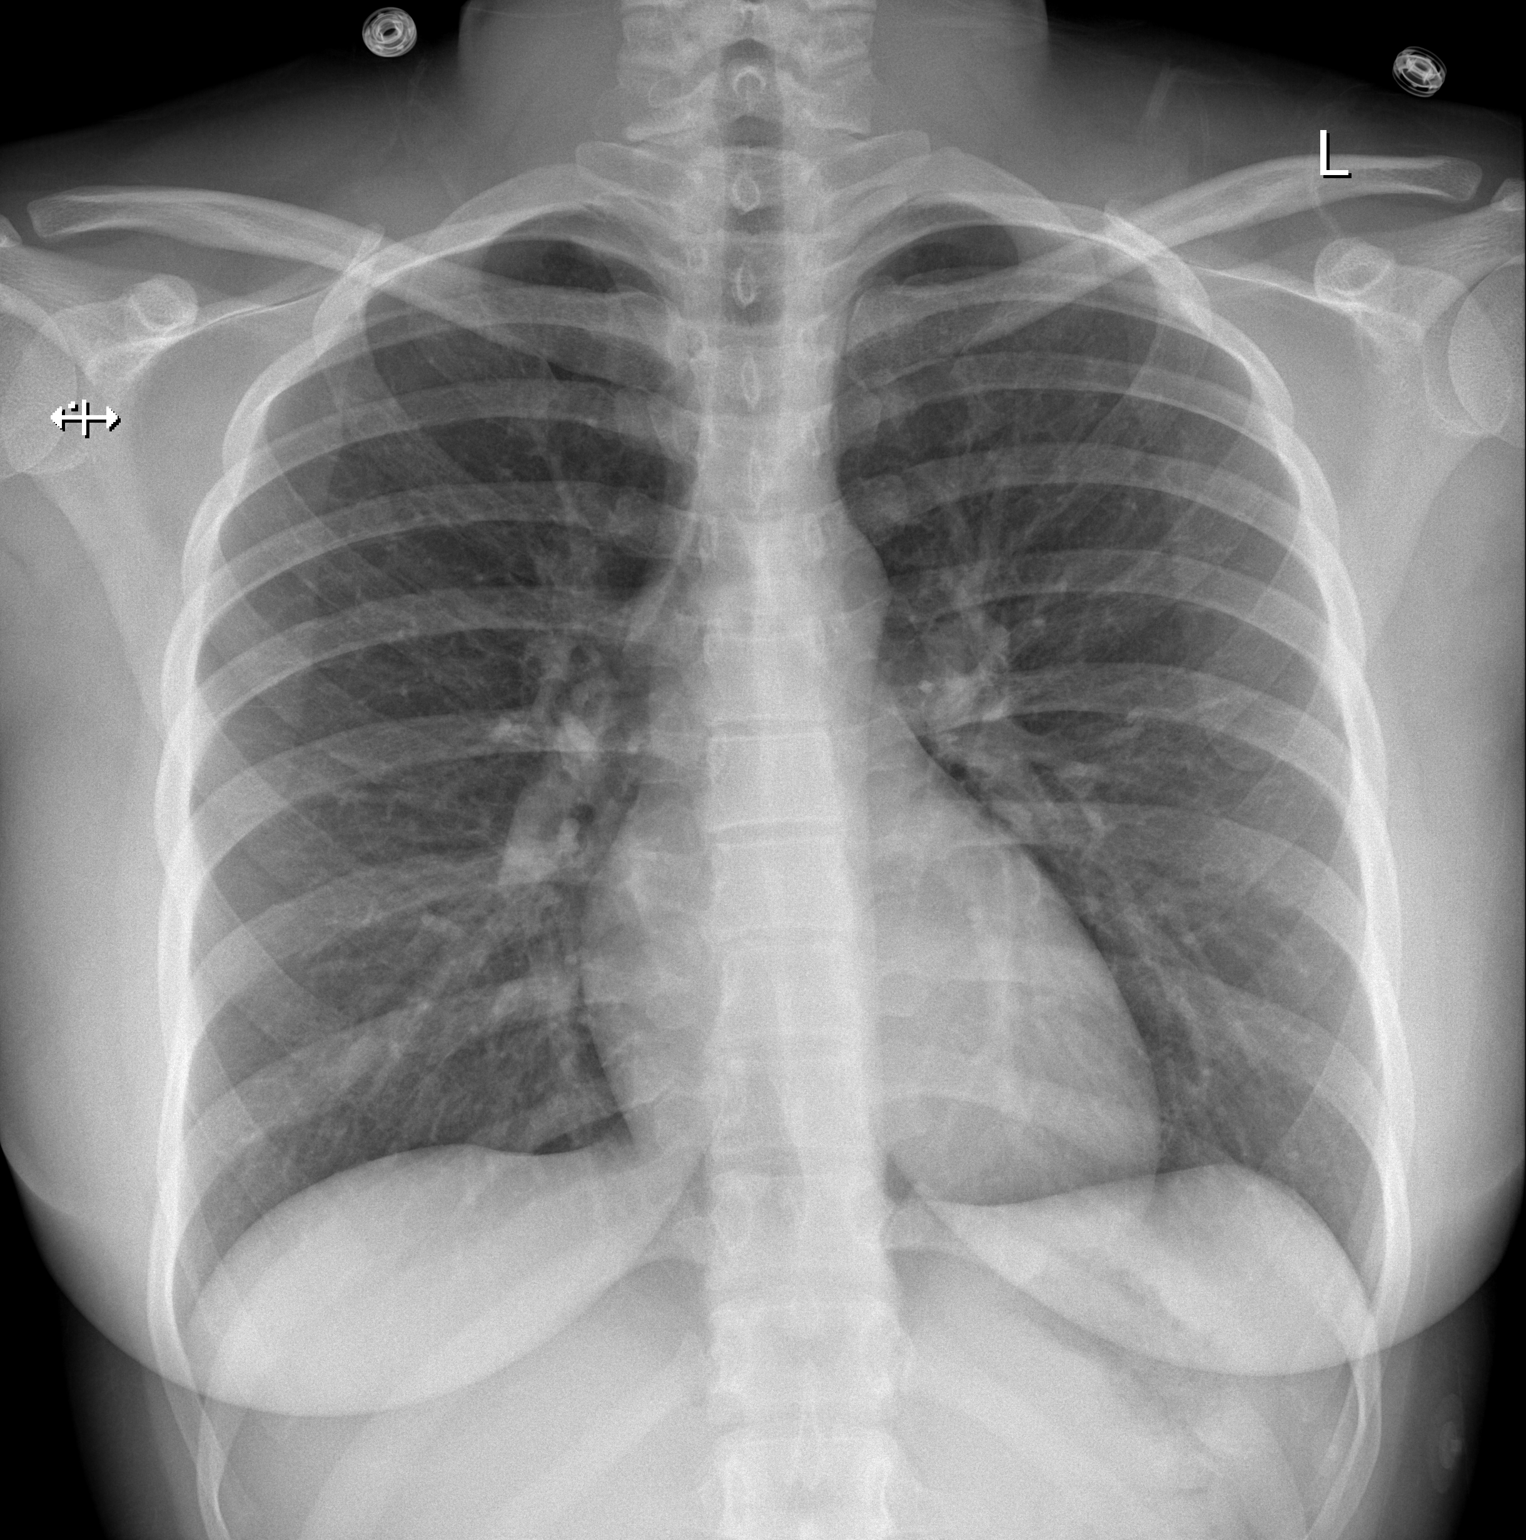

[w chest lat]
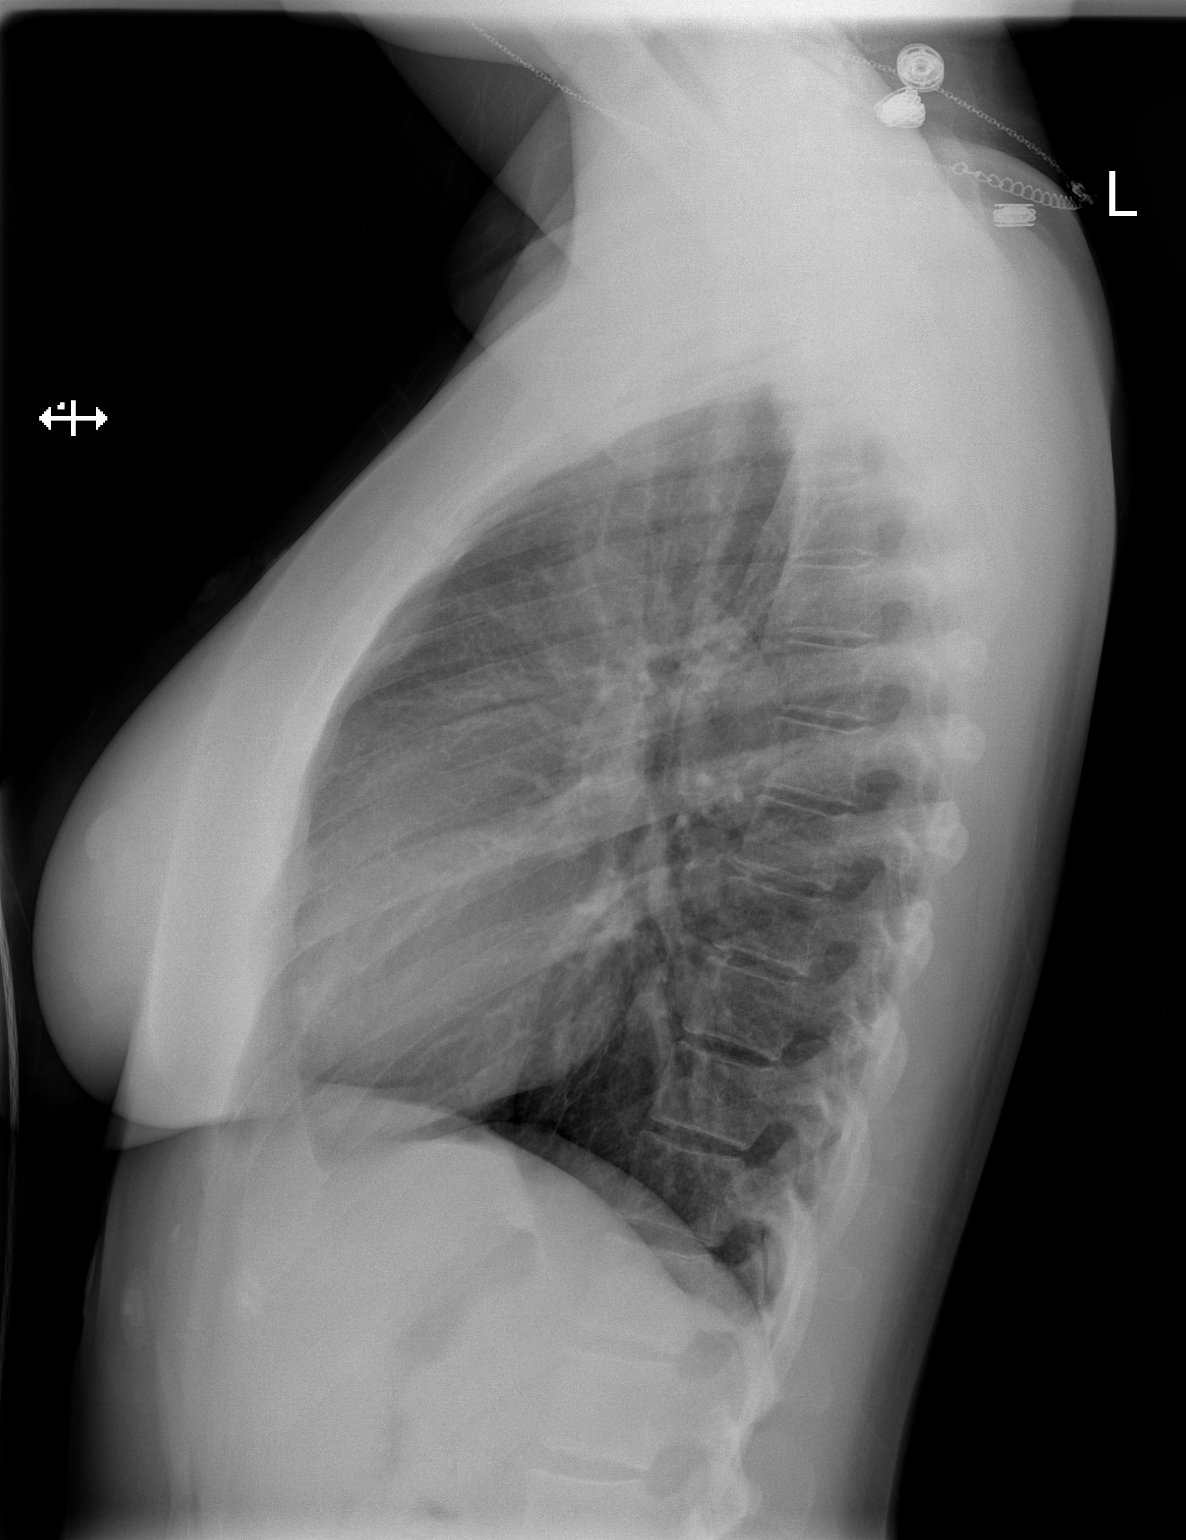

[2 of 2 positions shown; findings below may reference images not displayed]

FINDINGS: The heart size and mediastinal contours are within normal limits.
Both lungs are clear. The visualized skeletal structures are
unremarkable.
IMPRESSION: No active cardiopulmonary disease.

## 2015-05-12 ENCOUNTER — Ambulatory Visit: Payer: Self-pay | Admitting: Family Medicine

## 2015-06-18 ENCOUNTER — Ambulatory Visit: Payer: Self-pay | Admitting: Interventional Cardiology

## 2015-06-22 ENCOUNTER — Ambulatory Visit: Payer: Self-pay

## 2015-06-27 ENCOUNTER — Encounter (HOSPITAL_COMMUNITY): Payer: Self-pay | Admitting: *Deleted

## 2015-06-27 ENCOUNTER — Emergency Department (HOSPITAL_COMMUNITY): Payer: PRIVATE HEALTH INSURANCE

## 2015-06-27 ENCOUNTER — Emergency Department (HOSPITAL_COMMUNITY)
Admission: EM | Admit: 2015-06-27 | Discharge: 2015-06-27 | Disposition: A | Discharge status: Home / Self Care | Payer: PRIVATE HEALTH INSURANCE | Attending: Emergency Medicine | Admitting: Emergency Medicine

## 2015-06-27 DIAGNOSIS — Z87891 Personal history of nicotine dependence: Secondary | ICD-10-CM | POA: Insufficient documentation

## 2015-06-27 DIAGNOSIS — Z8619 Personal history of other infectious and parasitic diseases: Secondary | ICD-10-CM | POA: Insufficient documentation

## 2015-06-27 DIAGNOSIS — F419 Anxiety disorder, unspecified: Secondary | ICD-10-CM | POA: Insufficient documentation

## 2015-06-27 DIAGNOSIS — I252 Old myocardial infarction: Secondary | ICD-10-CM | POA: Insufficient documentation

## 2015-06-27 DIAGNOSIS — Z7982 Long term (current) use of aspirin: Secondary | ICD-10-CM | POA: Insufficient documentation

## 2015-06-27 DIAGNOSIS — R079 Chest pain, unspecified: Secondary | ICD-10-CM | POA: Insufficient documentation

## 2015-06-27 DIAGNOSIS — Z79899 Other long term (current) drug therapy: Secondary | ICD-10-CM | POA: Insufficient documentation

## 2015-06-27 DIAGNOSIS — F1210 Cannabis abuse, uncomplicated: Secondary | ICD-10-CM | POA: Insufficient documentation

## 2015-06-27 LAB — BASIC METABOLIC PANEL
Anion gap: 4 — ABNORMAL LOW (ref 5–15)
BUN: 7 mg/dL (ref 6–20)
CHLORIDE: 107 mmol/L (ref 101–111)
CO2: 25 mmol/L (ref 22–32)
Calcium: 9.2 mg/dL (ref 8.9–10.3)
Creatinine, Ser: 0.56 mg/dL (ref 0.44–1.00)
GFR calc Af Amer: >60 mL/min (ref >60)
GFR calc non Af Amer: >60 mL/min (ref >60)
Glucose, Bld: 108 mg/dL — ABNORMAL HIGH (ref 65–99)
POTASSIUM: 4.5 mmol/L (ref 3.5–5.1)
Sodium: 136 mmol/L (ref 135–145)

## 2015-06-27 LAB — URINE MICROSCOPIC-ADD ON

## 2015-06-27 LAB — URINALYSIS, ROUTINE W REFLEX MICROSCOPIC
Bilirubin Urine: NEGATIVE
GLUCOSE, UA: NEGATIVE mg/dL
Ketones, ur: NEGATIVE mg/dL
LEUKOCYTES UA: NEGATIVE
Nitrite: NEGATIVE
Protein, ur: NEGATIVE mg/dL
SPECIFIC GRAVITY, URINE: 1.013 (ref 1.005–1.030)
Urobilinogen, UA: 0.2 mg/dL (ref 0.0–1.0)
pH: 7 (ref 5.0–8.0)

## 2015-06-27 LAB — I-STAT TROPONIN, ED
Troponin i, poc: 0 ng/mL (ref 0.00–0.08)
Troponin i, poc: 0 ng/mL (ref 0.00–0.08)

## 2015-06-27 LAB — RAPID URINE DRUG SCREEN, HOSP PERFORMED
AMPHETAMINES: NOT DETECTED
BARBITURATES: NOT DETECTED
BENZODIAZEPINES: NOT DETECTED
Cocaine: NOT DETECTED
Opiates: NOT DETECTED
TETRAHYDROCANNABINOL: POSITIVE — AB

## 2015-06-27 LAB — CBC
HEMATOCRIT: 41.9 % (ref 36.0–46.0)
Hemoglobin: 14.1 g/dL (ref 12.0–15.0)
MCH: 30.3 pg (ref 26.0–34.0)
MCHC: 33.7 g/dL (ref 30.0–36.0)
MCV: 89.9 fL (ref 78.0–100.0)
Platelets: 325 10*3/uL (ref 150–400)
RBC: 4.66 MIL/uL (ref 3.87–5.11)
RDW: 13.5 % (ref 11.5–15.5)
WBC: 9.5 10*3/uL (ref 4.0–10.5)

## 2015-06-27 LAB — POC URINE PREG, ED: PREG TEST UR: NEGATIVE

## 2015-06-27 MED ORDER — DILTIAZEM HCL ER COATED BEADS 120 MG PO CP24: 120.0000 mg | ORAL_CAPSULE | Freq: Every day | ORAL | Status: DC

## 2015-06-27 MED ORDER — PANTOPRAZOLE SODIUM 20 MG PO TBEC: 20.0000 mg | DELAYED_RELEASE_TABLET | Freq: Every day | ORAL | Status: DC

## 2015-06-27 MED ORDER — ASPIRIN 81 MG PO CHEW
324.0000 mg | CHEWABLE_TABLET | Freq: Once | ORAL | Status: AC
  Administered 2015-06-27: 324 mg via ORAL
  Filled 2015-06-27: qty 4

## 2015-06-27 MED ORDER — HYDROXYZINE HCL 25 MG PO TABS: 25.0000 mg | ORAL_TABLET | Freq: Four times a day (QID) | ORAL | Status: DC

## 2015-06-27 MED ORDER — DILTIAZEM HCL ER COATED BEADS 120 MG PO CP24
120.0000 mg | ORAL_CAPSULE | Freq: Once | ORAL | Status: AC
  Administered 2015-06-27: 120 mg via ORAL
  Filled 2015-06-27: qty 1

## 2015-06-27 MED ORDER — GI COCKTAIL ~~LOC~~
30.0000 mL | Freq: Once | ORAL | Status: AC
Start: 2015-06-27 — End: 2015-06-27
  Administered 2015-06-27: 30 mL via ORAL
  Filled 2015-06-27: qty 30

## 2015-06-27 MED ORDER — PANTOPRAZOLE SODIUM 40 MG IV SOLR: 40.0000 mg | Freq: Once | INTRAVENOUS | Status: DC

## 2015-06-27 MED ORDER — PANTOPRAZOLE SODIUM 40 MG PO TBEC
40.0000 mg | DELAYED_RELEASE_TABLET | Freq: Once | ORAL | Status: AC
  Administered 2015-06-27: 40 mg via ORAL
  Filled 2015-06-27: qty 1

## 2015-06-27 NOTE — ED Notes (Addendum)
Pt states acute onset chest pain.  Hx of anxiety, but states she also has had NSTEMI.  Drank 4 shots last night.

## 2015-06-27 NOTE — Discharge Instructions (Signed)
Chest Pain (Nonspecific) °It is often hard to give a specific diagnosis for the cause of chest pain. There is always a chance that your pain could be related to something serious, such as a heart attack or a blood clot in the lungs. You need to follow up with your health care provider for further evaluation. °CAUSES  °· Heartburn. °· Pneumonia or bronchitis. °· Anxiety or stress. °· Inflammation around your heart (pericarditis) or lung (pleuritis or pleurisy). °· A blood clot in the lung. °· A collapsed lung (pneumothorax). It can develop suddenly on its own (spontaneous pneumothorax) or from trauma to the chest. °· Shingles infection (herpes zoster virus). °The chest wall is composed of bones, muscles, and cartilage. Any of these can be the source of the pain. °· The bones can be bruised by injury. °· The muscles or cartilage can be strained by coughing or overwork. °· The cartilage can be affected by inflammation and become sore (costochondritis). °DIAGNOSIS  °Lab tests or other studies may be needed to find the cause of your pain. Your health care provider may have you take a test called an ambulatory electrocardiogram (ECG). An ECG records your heartbeat patterns over a 24-hour period. You may also have other tests, such as: °· Transthoracic echocardiogram (TTE). During echocardiography, sound waves are used to evaluate how blood flows through your heart. °· Transesophageal echocardiogram (TEE). °· Cardiac monitoring. This allows your health care provider to monitor your heart rate and rhythm in real time. °· Holter monitor. This is a portable device that records your heartbeat and can help diagnose heart arrhythmias. It allows your health care provider to track your heart activity for several days, if needed. °· Stress tests by exercise or by giving medicine that makes the heart beat faster. °TREATMENT  °· Treatment depends on what may be causing your chest pain. Treatment may include: °· Acid blockers for  heartburn. °· Anti-inflammatory medicine. °· Pain medicine for inflammatory conditions. °· Antibiotics if an infection is present. °· You may be advised to change lifestyle habits. This includes stopping smoking and avoiding alcohol, caffeine, and chocolate. °· You may be advised to keep your head raised (elevated) when sleeping. This reduces the chance of acid going backward from your stomach into your esophagus. °Most of the time, nonspecific chest pain will improve within 2-3 days with rest and mild pain medicine.  °HOME CARE INSTRUCTIONS  °· If antibiotics were prescribed, take them as directed. Finish them even if you start to feel better. °· For the next few days, avoid physical activities that bring on chest pain. Continue physical activities as directed. °· Do not use any tobacco products, including cigarettes, chewing tobacco, or electronic cigarettes. °· Avoid drinking alcohol. °· Only take medicine as directed by your health care provider. °· Follow your health care provider's suggestions for further testing if your chest pain does not go away. °· Keep any follow-up appointments you made. If you do not go to an appointment, you could develop lasting (chronic) problems with pain. If there is any problem keeping an appointment, call to reschedule. °SEEK MEDICAL CARE IF:  °· Your chest pain does not go away, even after treatment. °· You have a rash with blisters on your chest. °· You have a fever. °SEEK IMMEDIATE MEDICAL CARE IF:  °· You have increased chest pain or pain that spreads to your arm, neck, jaw, back, or abdomen. °· You have shortness of breath. °· You have an increasing cough, or you cough   up blood. °· You have severe back or abdominal pain. °· You feel nauseous or vomit. °· You have severe weakness. °· You faint. °· You have chills. °This is an emergency. Do not wait to see if the pain will go away. Get medical help at once. Call your local emergency services (911 in [place] Do not drive  yourself to the hospital. °MAKE SURE YOU:  °· Understand these instructions. °· Will watch your condition. °· Will get help right away if you are not doing well or get worse. °Document Released: 07/19/2005 Document Revised: 10/14/2013 Document Reviewed: 05/14/2008 °ExitCare® Patient Information ©2015 [employer]. This information is not intended to replace advice given to you by your health care provider. Make sure you discuss any questions you have with your health care provider. °Gastroesophageal Reflux Disease, Adult °Gastroesophageal reflux disease (GERD) happens when acid from your stomach flows up into the esophagus. When acid comes in contact with the esophagus, the acid causes soreness (inflammation) in the esophagus. Over time, GERD may create small holes (ulcers) in the lining of the esophagus. °CAUSES  °· Increased body weight. This puts pressure on the stomach, making acid rise from the stomach into the esophagus. °· Smoking. This increases acid production in the stomach. °· Drinking alcohol. This causes decreased pressure in the lower esophageal sphincter (valve or ring of muscle between the esophagus and stomach), allowing acid from the stomach into the esophagus. °· Late evening meals and a full stomach. This increases pressure and acid production in the stomach. °· A malformed lower esophageal sphincter. °Sometimes, no cause is found. °SYMPTOMS  °· Burning pain in the lower part of the mid-chest behind the breastbone and in the mid-stomach area. This may occur twice a week or more often. °· Trouble swallowing. °· Sore throat. °· Dry cough. °· Asthma-like symptoms including chest tightness, shortness of breath, or wheezing. °DIAGNOSIS  °Your caregiver may be able to diagnose GERD based on your symptoms. In some cases, X-rays and other tests may be done to check for complications or to check the condition of your stomach and esophagus. °TREATMENT  °Your caregiver may recommend over-the-counter or  prescription medicines to help decrease acid production. Ask your caregiver before starting or adding any new medicines.  °HOME CARE INSTRUCTIONS  °· Change the factors that you can control. Ask your caregiver for guidance concerning weight loss, quitting smoking, and alcohol consumption. °· Avoid foods and drinks that make your symptoms worse, such as: °¨ Caffeine or alcoholic drinks. °¨ Chocolate. °¨ Peppermint or mint flavorings. °¨ Garlic and onions. °¨ Spicy foods. °¨ Citrus fruits, such as oranges, lemons, or limes. °¨ Tomato-based foods such as sauce, chili, salsa, and pizza. °¨ Fried and fatty foods. °· Avoid lying down for the 3 hours prior to your bedtime or prior to taking a nap. °· Eat small, frequent meals instead of large meals. °· Wear loose-fitting clothing. Do not wear anything tight around your waist that causes pressure on your stomach. °· Raise the head of your bed 6 to 8 inches with wood blocks to help you sleep. Extra pillows will not help. °· Only take over-the-counter or prescription medicines for pain, discomfort, or fever as directed by your caregiver. °· Do not take aspirin, ibuprofen, or other nonsteroidal anti-inflammatory drugs (NSAIDs). °SEEK IMMEDIATE MEDICAL CARE IF:  °· You have pain in your arms, neck, jaw, teeth, or back. °· Your pain increases or changes in intensity or duration. °· You develop nausea, vomiting, or sweating (diaphoresis). °·   You develop shortness of breath, or you faint.  Your vomit is green, yellow, black, or looks like coffee grounds or blood.  Your stool is red, bloody, or black. These symptoms could be signs of other problems, such as heart disease, gastric bleeding, or esophageal bleeding. MAKE SURE YOU:   Understand these instructions.  Will watch your condition.  Will get help right away if you are not doing well or get worse. Document Released: 07/19/2005 Document Revised: 01/01/2012 Document Reviewed: 04/28/2011 [facility] Patient  Information 2015 [place], [place]. This information is not intended to replace advice given to you by your health care provider. Make sure you discuss any questions you have with your health care provider. Gastritis, Adult Gastritis is soreness and swelling (inflammation) of the lining of the stomach. Gastritis can develop as a sudden onset (acute) or long-term (chronic) condition. If gastritis is not treated, it can lead to stomach bleeding and ulcers. CAUSES  Gastritis occurs when the stomach lining is weak or damaged. Digestive juices from the stomach then inflame the weakened stomach lining. The stomach lining may be weak or damaged due to viral or bacterial infections. One common bacterial infection is the Helicobacter pylori infection. Gastritis can also result from excessive alcohol consumption, taking certain medicines, or having too much acid in the stomach.  SYMPTOMS  In some cases, there are no symptoms. When symptoms are present, they may include:  Pain or a burning sensation in the upper abdomen.  Nausea.  Vomiting.  An uncomfortable feeling of fullness after eating. DIAGNOSIS  Your caregiver may suspect you have gastritis based on your symptoms and a physical exam. To determine the cause of your gastritis, your caregiver may perform the following:  Blood or stool tests to check for the H pylori bacterium.  Gastroscopy. A thin, flexible tube (endoscope) is passed down the esophagus and into the stomach. The endoscope has a light and camera on the end. Your caregiver uses the endoscope to view the inside of the stomach.  Taking a tissue sample (biopsy) from the stomach to examine under a microscope. TREATMENT  Depending on the cause of your gastritis, medicines may be prescribed. If you have a bacterial infection, such as an H pylori infection, antibiotics may be given. If your gastritis is caused by too much acid in the stomach, H2 blockers or antacids may be given. Your caregiver  may recommend that you stop taking aspirin, ibuprofen, or other nonsteroidal anti-inflammatory drugs (NSAIDs). HOME CARE INSTRUCTIONS  Only take over-the-counter or prescription medicines as directed by your caregiver.  If you were given antibiotic medicines, take them as directed. Finish them even if you start to feel better.  Drink enough fluids to keep your urine clear or pale yellow.  Avoid foods and drinks that make your symptoms worse, such as:  Caffeine or alcoholic drinks.  Chocolate.  Peppermint or mint flavorings.  Garlic and onions.  Spicy foods.  Citrus fruits, such as oranges, lemons, or limes.  Tomato-based foods such as sauce, chili, salsa, and pizza.  Fried and fatty foods.  Eat small, frequent meals instead of large meals. SEEK IMMEDIATE MEDICAL CARE IF:   You have black or dark red stools.  You vomit blood or material that looks like coffee grounds.  You are unable to keep fluids down.  Your abdominal pain gets worse.  You have a fever.  You do not feel better after 1 week.  You have any other questions or concerns. MAKE SURE YOU:  Understand these  instructions.  Will watch your condition.  Will get help right away if you are not doing well or get worse. Document Released: 10/03/2001 Document Revised: 04/09/2012 Document Reviewed: 11/22/2011 [facility] Patient Information 2015 [place], [place]. This information is not intended to replace advice given to you by your health care provider. Make sure you discuss any questions you have with your health care provider.   Emergency Department Resource Guide 1) Find a Doctor and Pay Out of Pocket Although you won't have to find out who is covered by your insurance plan, it is a good idea to ask around and get recommendations. You will then need to call the office and see if the doctor you have chosen will accept you as a new patient and what types of options they offer for patients who are self-pay. Some  doctors offer discounts or will set up payment plans for their patients who do not have insurance, but you will need to ask so you aren't surprised when you get to your appointment.  2) Contact Your Local Health Department Not all health departments have doctors that can see patients for sick visits, but many do, so it is worth a call to see if yours does. If you don't know where your local health department is, you can check in your phone book. The CDC also has a tool to help you locate your state's health department, and many state websites also have listings of all of their local health departments.  3) Find a [facility] If your illness is not likely to be very severe or complicated, you may want to try a walk in clinic. These are popping up all over the country in pharmacies, drugstores, and shopping centers. They're usually staffed by nurse practitioners or physician assistants that have been trained to treat common illnesses and complaints. They're usually fairly quick and inexpensive. However, if you have serious medical issues or chronic medical problems, these are probably not your best option.  No Primary Care Doctor: - Call Health Connect at  [phone] - they can help you locate a primary care doctor that  accepts your insurance, provides certain services, etc. - Physician Referral Service- [phone]  Chronic Pain Problems: Organization         Address  Phone   Notes  [facility]  [phone] Patients need to be referred by their primary care doctor.   Medication Assistance: Organization         Address  Phone   Notes  [facility] Medication [facility] [address] [place], [place] [address] [phone] --Must be a resident of [facility] -- Must have NO insurance coverage whatsoever (no Medicaid/ Medicare, etc.) -- The pt. MUST have a primary care doctor that directs their care regularly and follows them in the  community   MedAssist  [phone]   [employer]  [phone]    Agencies that provide inexpensive medical care: Organization         Address  Phone   Notes  [place]  [phone]   [facility]    [phone]   [facility] [place], [place] [address] [phone]   [place] [address] [place] [phone]   Planned Parenthood    [phone]   [facility]    [phone]   [place] and [place]  [address], [place] Phone:  [phone], Fax:  [phone] Hours of Operation:  9 am - 6 pm, M-F.  Also accepts Medicaid/Medicare and self-pay.  [facility] for [place] [place], [address], [place] Phone: [phone], Fax: [phone]. Hours of Operation:  8:30 am - 5:30 pm, M-F.  Also accepts Medicaid and self-pay.  [facility] [place] [address] [place] Phone: [phone]   [place], [place], [place] [phone], Ext. [phone] Mondays & Thursdays: 7-9 AM.  First 15 patients are seen on a first come, first serve basis.    [place] Providers:  Organization         Address  Phone   Notes  [facility] [address], [place] [phone] Also accepts self-pay patients.  [facility] [address], [place]  [phone]   [place], [address], [place] [phone]   [facility] [address] [place] [phone]   Lucianne Lei [address], [place]   [phone] Only accepts [place] Access [place] patients after they have their name applied to their card.   Self-Pay (no insurance) in [facility]:  Organization         Address  Phone   Notes  Sickle Cell Patients, [facility]  Internal Medicine [place] [phone]   [facility] [place] [phone]   [facility] [place]  [place], [place], [place] [phone]   [facility]/Dr. Osei-Bonsu  [address] [place] or [address], [place] [phone] Phone number for both [place] and [place] locations is the same.  [facility] [address] [place] [phone]   [facility] [address] [place] or [address] [phone]   [facility] [address] [place] [phone], phone; [phone], fax Sees patients 1st and 3rd Saturday of every month.  Must not qualify for public or private insurance (i.e. Medicaid, Medicare, [place] Health Choice, Veterans' Benefits)  Household income should be no more than 200% of the poverty level The clinic cannot treat you if you are pregnant or think you are pregnant  Sexually transmitted diseases are not treated at the clinic.    Dental Care: Organization         Address  Phone  Notes  [facility] Department of [facility] [place] [phone] Accepts children up to age 65 who are enrolled in [place] or [place]; pregnant women with a Medicaid card; and children who have applied for Medicaid or [place] Health Choice, but were declined, whose parents can pay a reduced fee at time of service.  [facility] Department of [facility]  [address], [place] [phone] Accepts children up to age 32 who are enrolled in [place] or [place]; pregnant women with a Medicaid card; and children who have applied for Medicaid or [place] Health Choice, but were declined, whose parents can pay a reduced fee at time of service.  [facility] Adult Dental Access PROGRAM  [place] [phone] Patients are  seen by appointment only. Walk-ins are not accepted. [place] will see patients 26 years of age and older. Monday - Tuesday (8am-5pm) Most Wednesdays (8:30-5pm) $30 per visit, cash only  [facility] Adult Dental Access PROGRAM  [address], [facility] [phone] Patients are seen by appointment only. Walk-ins are not accepted. [place] will see patients 51 years of age and older. One Wednesday Evening (Monthly: Volunteer Based).  $30 per visit, cash only  [place]  [phone] for adults; Children under age 90, call Graduate Pediatric Dentistry at [phone]. Children aged 78-14, please call [phone] to request a pediatric application.  Dental services are provided in all areas of dental care including fillings, crowns and bridges, complete and partial dentures, implants, gum treatment, root canals, and extractions. Preventive care is also provided. Treatment is provided to both adults and children. Patients are selected via a lottery and there is often a waiting list.   [facility] [address], [place]  [phone] [email or web address]   [facility] [address] [place], [place] [phone], Ext. [phone] Second and Fourth Thursday of each month, opens at 6:30 AM; Clinic ends at 9 AM.  Patients are seen on a first-come first-served basis, and a limited number are seen during each clinic.   [facility]  [address] Hillard Danker [place], [place] [phone]   Eligibility Requirements You must have lived in [place], [place], or [place] counties for at least the last three months.   You cannot be eligible for state or federal sponsored [employer], including [employer], [place], or [employer].   You generally cannot be eligible for healthcare insurance through your employer.    How to apply: Eligibility screenings are held every  Tuesday and Wednesday afternoon from 1:00 pm until 4:00 pm. You do not need an appointment for the interview!  [facility] [address] [place], [place]   [place]  [place] Department  [place]  [phone]    Behavioral Health Resources in the Community: Intensive Outpatient Programs Organization         Address  Phone  Notes  [place]. [address] [place], [place] [phone]   [facility] Outpatient [address] [place], [place]   ADS: Alcohol & Drug S[facility] [address], [place], [place]   [place] [address]  [place], [place] or [phone]   Substance Abuse Resources Organization         Address  Phone  Notes  Alcohol and Drug Services  [phone]   [place]  [phone]   [facility] [place]   Chinita Pester  [phone]   Residential & Outpatient Substance Abuse Program  [phone]   Psychological Services Organization         Address  Phone  Notes  [facility] [place]  [place]  [phone]   [place] [address] [place] or [phone]    Mobile Crisis Teams Organization         Address  Phone  Notes  [facility], Mobile Crisis Care Unit  [phone]   [facility]  [address] [place], [place]   [facility] [address] [place]  [phone]    Self-Help/Support Groups Organization         Address  Phone             Notes  [facility] of [place] - variety of support groups  [place] Call for more information  Narcotics Anonymous (NA), [facility] [address], [employer] [place]  2 meetings at this location    Special educational needs teacher         Address  Phone  Notes  ASAP Residential Treatment [place],    [place]  [phone]   [facility]  [address] [place] [phone], [place], [place]   [place]ina [place], [place] [phone] Admissions: 8am-3pm M-F  Incentives Substance [place] [address]    [place], [place] [phone]   [facility] [address] [place], [place], [place]   [facility] [address],  [place], [place]   [facility] - Intensive Outpatient [address] Kristeen [address], [place], [place]   [facility] ([place] [address].,  [place], [place] [phone] or [phone]   [facility] (RTS) [address] [place], [place] Accepts Medicaid  [facility] [place] [address]  [place] [phone] Substance Abuse/Addiction Treatment   [facility] Organization         Address  Phone  Notes  [facility]  [phone]   Domenic Schwab, PhD [address] Arlis Porta [place], [place]   [phone] or [phone]   [place] Willis [place], [place] [phone]   [facility] [address] [place], [place] [phone] Insurance/Medicaid/sponsorship through [facility] and Families [address], [address] [place]                                    [place], [place] [phone] [place] [address], [place] [phone]    Dr. Adele Schilder  [phone]   [facility] Dept. 1) [address] [place] 2) [place] 3)  [address], [place] [phone]  [phone]   [place] [phone] or [phone] (After  Hours)

## 2015-06-27 NOTE — ED Provider Notes (Signed)
CSN: [id number]     Arrival date & time 06/27/15  1122 History   First MD Initiated Contact with Patient 06/27/15 1152     Chief Complaint  Patient presents with  . Chest Pain     (Consider location/radiation/quality/duration/timing/severity/associated sxs/prior Treatment) HPI   Patient is a 24 year old female with history of anxiety, chest pain with elevated troponins in May 2015, with negative coronary catheterization ruling out CAD and ischemia, thought to possibly be vasospasm, and she presents to the ER today with sudden onset chest pain which occurred at approximately 10:30 AM, while she was sitting at her desk at work. She initially had a warm sensation throughout her chest with a following tightening and squeezing feeling located at her central and left chest.  The sensation having intermittent waves of pain with tightening, 9/10 to 10/10.  She states that she has long history of chest pains since admission last year, she also suffers from anxiety, and today when her chest pain began she states she took her pulse and began to become anxious.  Her chest. Baseline is located in the same place in her central chest, is worse at night, and worsened with lying on her left side back, states that she notices her stomach to get some relief in the pain. Currently the pain is improved with sitting upright.  She states she ate greasy foods late last night, and did not eat this morning. She denies having any dyspepsia or GERD symptoms, however when she has presented to the ER with chest pain she states the GI cocktails have relieved her pain and effectively. She states that she has not been compliant with her diltiazem and aspirin for possible vasospasm, she states that she has attempted to use Klonopin, Ativan, and a daily SSRI for anxiety, all of which she states did not work or she did not want to take.  She denies having any shortness of breath, diaphoresis, nausea, vomiting, cough, sweats, chills, fever,  palpitations, lower extremity edema, syncope or dizziness.    Past Medical History  Diagnosis Date  . Chlamydia   . BV (bacterial vaginosis)   . Anxiety   . MI (myocardial infarction)   . Elevated troponin 03/19/2014   Past Surgical History  Procedure Laterality Date  . Ankle fracture surgery    . Cardiac catheterization    . Left heart catheterization with coronary angiogram N/A 03/20/2014    Procedure: LEFT HEART CATHETERIZATION WITH CORONARY ANGIOGRAM;  Surgeon: Burnell Blanks, MD;  Location: [facility] CATH LAB;  Service: Cardiovascular;  Laterality: N/A;  . Wisdom tooth extraction     Family History  Problem Relation Age of Onset  . Diabetes Maternal Grandmother   . Hypertension Maternal Grandmother   . GER disease Mother   . Heart failure Maternal Grandmother    Social History  Substance Use Topics  . Smoking status: Former Smoker -- 0.30 packs/day for 4 years    Types: Cigarettes    Quit date: 09/16/2014  . Smokeless tobacco: None  . Alcohol Use: Yes     Comment: occ shot   OB History    Gravida Para Term Preterm AB TAB SAB Ectopic Multiple Living   1              Review of Systems  Skin: Negative.   Neurological: Negative.   Psychiatric/Behavioral: The patient is nervous/anxious.     Allergies  Review of patient's allergies indicates no known allergies.  Home Medications   Prior to Admission  medications   Medication Sig Start Date End Date Taking? Authorizing Provider  aspirin 81 MG tablet Take 81 mg by mouth daily.   Yes Historical Provider, MD  CRANBERRY PO Take 1 capsule by mouth daily.   Yes Historical Provider, MD  etonogestrel (NEXPLANON) 68 MG IMPL implant 1 each by Subdermal route once.   Yes Historical Provider, MD  amitriptyline (ELAVIL) 25 MG tablet Take 1 tablet (25 mg total) by mouth at bedtime. Patient not taking: Reported on 06/27/2015 04/14/15   Timmothy Euler, MD  diltiazem (CARDIZEM CD) 120 MG 24 hr capsule Take 1 capsule (120 mg total)  by mouth daily. 06/27/15   Delsa Grana, PA-C  hydrOXYzine (ATARAX/VISTARIL) 25 MG tablet Take 1 tablet (25 mg total) by mouth every 6 (six) hours. 06/27/15   Delsa Grana, PA-C  pantoprazole (PROTONIX) 20 MG tablet Take 1 tablet (20 mg total) by mouth daily. 06/27/15   Delsa Grana, PA-C   BP 114/64 mmHg  Pulse 76  Temp(Src) 98.2 F (36.8 C) (Oral)  Resp 18  Ht 5' (1.524 m)  Wt 156 lb (70.761 kg)  BMI 30.47 kg/m2  SpO2 99%  LMP 06/26/2015 Physical Exam  Constitutional: She is oriented to person, place, and time. She appears well-developed and well-nourished. No distress.  Well-developed well-nourished female, appears somewhat anxious, intermittently clutching chest in pain, no diaphoresis  HENT:  Head: Normocephalic and atraumatic.  Nose: Nose normal.  Mouth/Throat: Oropharynx is clear and moist. No oropharyngeal exudate.  Eyes: Conjunctivae and EOM are normal. Pupils are equal, round, and reactive to light. Right eye exhibits no discharge. Left eye exhibits no discharge. No scleral icterus.  Neck: Normal range of motion. No JVD present. No tracheal deviation present. No thyromegaly present.  Cardiovascular: Normal rate, regular rhythm, normal heart sounds and intact distal pulses.  Exam reveals no gallop and no friction rub.   No murmur heard. Pulses:      Radial pulses are 2+ on the right side, and 2+ on the left side.       Dorsalis pedis pulses are 2+ on the right side, and 2+ on the left side.  Pulmonary/Chest: Effort normal and breath sounds normal. No accessory muscle usage. No respiratory distress. She has no decreased breath sounds. She has no wheezes. She has no rhonchi. She has no rales. She exhibits no tenderness.  Abdominal: Soft. Normal appearance and bowel sounds are normal. She exhibits no distension and no mass. There is no tenderness. There is no rigidity, no rebound and no guarding.  Musculoskeletal: Normal range of motion. She exhibits no edema or tenderness.   Lymphadenopathy:    She has no cervical adenopathy.  Neurological: She is alert and oriented to person, place, and time. She has normal reflexes. No cranial nerve deficit. She exhibits normal muscle tone. Coordination normal.  Skin: Skin is warm and dry. No rash noted. She is not diaphoretic. No cyanosis or erythema. No pallor. Nails show no clubbing.  Psychiatric: Her behavior is normal. Judgment and thought content normal. Her mood appears anxious. Cognition and memory are normal.  Nursing note and vitals reviewed.      ED Course  Procedures (including critical care time) Labs Review Labs Reviewed  BASIC METABOLIC PANEL - Abnormal; Notable for the following:    Glucose, Bld 108 (*)    Anion gap 4 (*)    All other components within normal limits  URINALYSIS, ROUTINE W REFLEX MICROSCOPIC (NOT AT [facility]) - Abnormal; Notable for the following:  Hgb urine dipstick MODERATE (*)    All other components within normal limits  URINE RAPID DRUG SCREEN, HOSP PERFORMED - Abnormal; Notable for the following:    Tetrahydrocannabinol POSITIVE (*)    All other components within normal limits  URINE MICROSCOPIC-ADD ON - Abnormal; Notable for the following:    Squamous Epithelial / LPF FEW (*)    All other components within normal limits  CBC  I-STAT TROPOININ, ED  POC URINE PREG, ED  Randolm Idol, ED    Imaging Review Dg Chest 2 View  06/27/2015   CLINICAL DATA:  Acute onset chest pain. History of anxiety. History of NSTEMI. Drank 4 shots last night. Shortness of breath.  EXAM: CHEST  2 VIEW  COMPARISON:  None.  FINDINGS: The heart size and mediastinal contours are within normal limits. Both lungs are clear. The visualized skeletal structures are unremarkable.  IMPRESSION: No active cardiopulmonary disease.   Electronically Signed   By: Nolon Nations M.D.   On: 06/27/2015 13:04   I have personally reviewed and evaluated these images and lab results as part of my medical  decision-making.   EKG Interpretation   Date/Time:  Sunday June 27 2015 11:32:51 EDT Ventricular Rate:  71 PR Interval:  148 QRS Duration: 76 QT Interval:  372 QTC Calculation: 404 R Axis:   59 Text Interpretation:  Normal sinus rhythm with sinus arrhythmia Normal ECG  Confirmed by Jeneen Rinks  MD, Delta ([id number]) on 06/27/2015 12:34:10 PM      MDM   Final diagnoses:  Chest pain, unspecified chest pain type    Patient with central chest pain/epigastric pain, history of anxiety, and a history of chest pain with elevated troponin but negative workup for CAD or MI.   Chest pain workup initiated in the ER, also given the patient is a cocktail with significant improvement of her chest pain.  Pt EKG negative for STEMI or changes, initial troponin is negative, CXR negative, likely etiology of CP may be GI.   The patient has adequate follow-up with family medicine, she does have a cardiologist. She has had her anxiety worked up and treated with several different medications, she has been noncompliant with several of them. She also admits in compliance with her Cardizem and aspirin which she specifically take daily. They will be refilled for her today. I spoke with her about the benefits of taking a PPI, to see if it will resolve her symptoms which may be from gastritis or esophagitis. I have emphasized that this is another daily medicine which she will need to be compliant with in order to see the benefits of.   Upon my first exam she declined any treatment with anxiolytics, however now she is asking for xanax or klonopin, which she initially stated did not work for her anxiety attacks.   I do not feel that prescribing benzos to this patient is in her best interest, but will recommend outpatient follow-up and give resource guide. She will be given Vistaril for anxiety.  A second troponin was drawn which was also negative, patient will be discharged home. The patient and her case has been discussed  with Dr. Lovena Le agreement with the workup and treatment plan.    Medications  gi cocktail (Maalox,Lidocaine,Donnatal) (30 mLs Oral Given 06/27/15 1313)  aspirin chewable tablet 324 mg (324 mg Oral Given 06/27/15 1542)  diltiazem (CARDIZEM CD) 24 hr capsule 120 mg (120 mg Oral Given 06/27/15 1542)  pantoprazole (PROTONIX) EC tablet 40 mg (40 mg  Oral Given 06/27/15 1542)   Filed Vitals:   06/27/15 1500 06/27/15 1510 06/27/15 1545 06/27/15 1600  BP: 130/82 118/71 124/83 114/64  Pulse: 91 92 83 76  Temp:      TempSrc:      Resp: 19 16 20 18   Height:      Weight:      SpO2: 100% 99% 100% 99%       Delsa Grana, PA-C 06/29/15 0024  Tanna Furry, MD 07/06/15 2225

## 2015-07-06 ENCOUNTER — Inpatient Hospital Stay: Payer: Self-pay | Admitting: Family Medicine

## 2015-07-09 ENCOUNTER — Emergency Department (INDEPENDENT_AMBULATORY_CARE_PROVIDER_SITE_OTHER)
Admission: EM | Admit: 2015-07-09 | Discharge: 2015-07-09 | Disposition: A | Discharge status: Home / Self Care | Payer: Self-pay | Source: Home / Self Care | Attending: Family Medicine | Admitting: Family Medicine

## 2015-07-09 ENCOUNTER — Encounter (HOSPITAL_COMMUNITY): Payer: Self-pay | Admitting: Emergency Medicine

## 2015-07-09 DIAGNOSIS — N939 Abnormal uterine and vaginal bleeding, unspecified: Secondary | ICD-10-CM

## 2015-07-09 MED ORDER — MEDROXYPROGESTERONE ACETATE 10 MG PO TABS: 10.0000 mg | ORAL_TABLET | Freq: Every day | ORAL | Status: AC

## 2015-07-09 NOTE — ED Provider Notes (Addendum)
CSN: [id number]     Arrival date & time 07/09/15  1319 History   First MD Initiated Contact with Patient 07/09/15 1412     Chief Complaint  Patient presents with  . Vaginal Bleeding   (Consider location/radiation/quality/duration/timing/severity/associated sxs/prior Treatment) HPI  Vaginal bleeding since March when she had the Nexplanon put in. Patient states that her periods are completely regular income last anywhere from a couple of days to a couple of weeks. She has had been a small before without any consultation very little to no menstrual bleeding. Patient section active and denies any other vaginal discharge, vaginal irritation, dysuria, frequency, lower abdominal pain, flank pain, back pain, fevers, nausea, vomiting, constipation, diarrhea.   Past Medical History  Diagnosis Date  . Chlamydia   . BV (bacterial vaginosis)   . Anxiety   . MI (myocardial infarction)   . Elevated troponin 03/19/2014   Past Surgical History  Procedure Laterality Date  . Ankle fracture surgery    . Cardiac catheterization    . Left heart catheterization with coronary angiogram N/A 03/20/2014    Procedure: LEFT HEART CATHETERIZATION WITH CORONARY ANGIOGRAM;  Surgeon: Burnell Blanks, MD;  Location: [facility] CATH LAB;  Service: Cardiovascular;  Laterality: N/A;  . Wisdom tooth extraction     Family History  Problem Relation Age of Onset  . Diabetes Maternal Grandmother   . Hypertension Maternal Grandmother   . GER disease Mother   . Heart failure Maternal Grandmother    Social History  Substance Use Topics  . Smoking status: Former Smoker -- 0.30 packs/day for 4 years    Types: Cigarettes    Quit date: 09/16/2014  . Smokeless tobacco: None  . Alcohol Use: Yes     Comment: occ shot   OB History    Gravida Para Term Preterm AB TAB SAB Ectopic Multiple Living   1              Review of Systems Per HPI with all other pertinent systems negative.   Allergies  Review of patient's  allergies indicates no known allergies.  Home Medications   Prior to Admission medications   Medication Sig Start Date End Date Taking? Authorizing Deema Juncaj  amitriptyline (ELAVIL) 25 MG tablet Take 1 tablet (25 mg total) by mouth at bedtime. Patient not taking: Reported on 06/27/2015 04/14/15   Timmothy Euler, MD  aspirin 81 MG tablet Take 81 mg by mouth daily.    Historical Karin Griffith, MD  CRANBERRY PO Take 1 capsule by mouth daily.    Historical Aveya Beal, MD  diltiazem (CARDIZEM CD) 120 MG 24 hr capsule Take 1 capsule (120 mg total) by mouth daily. 06/27/15   Delsa Grana, PA-C  etonogestrel (NEXPLANON) 68 MG IMPL implant 1 each by Subdermal route once.    Historical Verneal Wiers, MD  hydrOXYzine (ATARAX/VISTARIL) 25 MG tablet Take 1 tablet (25 mg total) by mouth every 6 (six) hours. 06/27/15   Delsa Grana, PA-C  pantoprazole (PROTONIX) 20 MG tablet Take 1 tablet (20 mg total) by mouth daily. 06/27/15   Delsa Grana, PA-C   Meds Ordered and Administered this Visit  Medications - No data to display  BP 132/80 mmHg  Pulse 85  Temp(Src) 98.8 F (37.1 C) (Oral)  Resp 12  SpO2 100%  LMP 06/26/2015 No data found.   Physical Exam Physical Exam  Constitutional: oriented to person, place, and time. appears well-developed and well-nourished. No distress.  HENT:  Head: Normocephalic and atraumatic.  Eyes: EOMI. PERRL.  Neck: Normal range of motion.  Cardiovascular: RRR, no m/r/g, 2+ distal pulses,  Pulmonary/Chest: Effort normal and breath sounds normal. No respiratory distress.  Abdominal: Soft. Bowel sounds are normal. NonTTP, no distension.  Musculoskeletal: Normal range of motion. Non ttp, no effusion.  Neurological: alert and oriented to person, place, and time.  Skin: Skin is warm. No rash noted. non diaphoretic.  Psychiatric: normal mood and affect. behavior is normal. Judgment and thought content normal.   ED Course  Procedures (including critical care time)  Labs Review Labs  Reviewed - No data to display  Imaging Review No results found.   Visual Acuity Review  Right Eye Distance:   Left Eye Distance:   Bilateral Distance:    Right Eye Near:   Left Eye Near:    Bilateral Near:         MDM   1. Vaginal bleeding    Discussed how she's still in the window for menstrual irregularity while on the Nexplanon.  Provera 10mg  x5 days If continues beyond 9-12 mo would recommend pt consider DC nexplanon if desired.  F/u PCP or OBGYN. Labs reviewed and urine preg from 06/27/15 neg.  Consider estrogen therapy if not improving  Waldemar Dickens, MD 07/09/15 [place], MD 07/09/15 [phone]

## 2015-07-09 NOTE — ED Notes (Signed)
C/o abn vaginal bleeding since being placed on Nexplanon 09/2014 Alert and oriented x4... No acute distress.

## 2015-07-09 NOTE — Discharge Instructions (Signed)
The menstrual irregularity you are experiencing is to be expected on the Nexplanon. This still may last another 3-6 months at the longest. Please use the Provera as discussed. Please follow-up with your primary care physician or with an OB/GYN for removal of the Nexplanon if it does not seem to be working.

## 2015-07-19 ENCOUNTER — Ambulatory Visit: Payer: Self-pay | Admitting: Cardiology

## 2015-08-10 ENCOUNTER — Ambulatory Visit: Payer: Self-pay | Admitting: Family Medicine

## 2015-08-11 ENCOUNTER — Telehealth: Payer: Self-pay | Admitting: Family Medicine

## 2015-08-11 NOTE — Telephone Encounter (Signed)
Pt called and would like a prescription for medication for a yeast infection. She said the doctor called some in at another time. She would like this sent to [facility] on [address]. jw

## 2015-08-11 NOTE — Telephone Encounter (Signed)
I have never seen patient before. There is over the counter antifungal medications for yeast infections (Monistat). Previously called in medication was after the proper test had resolved.

## 2015-08-12 NOTE — Telephone Encounter (Signed)
Spoke with patient, appt schedule for 10/22 for vaginal discharge.

## 2015-08-13 ENCOUNTER — Ambulatory Visit (INDEPENDENT_AMBULATORY_CARE_PROVIDER_SITE_OTHER): Payer: Self-pay | Admitting: Family Medicine

## 2015-08-13 ENCOUNTER — Other Ambulatory Visit (HOSPITAL_COMMUNITY)
Admission: RE | Admit: 2015-08-13 | Discharge: 2015-08-13 | Disposition: A | Discharge status: Home / Self Care | Payer: Medicaid Other | Source: Ambulatory Visit | Attending: Family Medicine | Admitting: Family Medicine

## 2015-08-13 DIAGNOSIS — Z202 Contact with and (suspected) exposure to infections with a predominantly sexual mode of transmission: Secondary | ICD-10-CM

## 2015-08-13 DIAGNOSIS — N898 Other specified noninflammatory disorders of vagina: Secondary | ICD-10-CM

## 2015-08-13 DIAGNOSIS — Z113 Encounter for screening for infections with a predominantly sexual mode of transmission: Secondary | ICD-10-CM | POA: Insufficient documentation

## 2015-08-13 DIAGNOSIS — L298 Other pruritus: Secondary | ICD-10-CM

## 2015-08-13 LAB — POCT WET PREP (WET MOUNT): CLUE CELLS WET PREP WHIFF POC: NEGATIVE

## 2015-08-13 LAB — POCT URINALYSIS DIPSTICK
Bilirubin, UA: NEGATIVE
GLUCOSE UA: NEGATIVE
Ketones, UA: 15
NITRITE UA: NEGATIVE
PH UA: 5.5
PROTEIN UA: NEGATIVE
UROBILINOGEN UA: 0.2

## 2015-08-13 MED ORDER — RANITIDINE HCL 150 MG PO TABS: 150.0000 mg | ORAL_TABLET | Freq: Two times a day (BID) | ORAL | Status: DC

## 2015-08-13 MED ORDER — FLUCONAZOLE 150 MG PO TABS: ORAL_TABLET | ORAL | Status: DC

## 2015-08-13 NOTE — Progress Notes (Signed)
   HPI  CC: possible yeast infection Would like STD check. Thinks has yeast infection. No discharge. Mild itching over the past 2 weeks. No new partners. Not using condoms. Irritation/itching at 10:00-2:00 aspect of labia majora. Has had sxs like this in the past. Had had good response with treatment.   ROS: Denies fever, chills, n/v/d/c, SOB, CP, Abdom pain, back/flank pain, frequence, dysuria, pyuria, hematuria.   Objective: T: 98.5, BP 142/84, HR 94, Wt 153.5lbs Gen: NAD, alert, cooperative, and pleasant. Abd: SNTND, BS present, no guarding or organomegaly Pelvic exam: normal external genitalia, vulva, vagina, cervix, uterus and adnexa, thick curd-like discharge noted. Exam chaperoned by Darrick Penna. Neuro: Alert and oriented, Speech clear, No gross deficits  Assessment and plan:  Vaginal itching Patient with concerns about vaginal itching. Believes she has a yeast infection but would like a STD check as well. Patient denied discharge, however thick curd-like discharge noted in vaginal vault. Evidence of yeast on wert mount.  - Fluconazole 150mg  Q72 hours x2 doses - STD labs obtained and pending  Potential exposure to STD As above.    Orders Placed This Encounter  Procedures  . Urine culture  . HIV antibody (with reflex)  . RPR  . Hepatitis panel, acute  . POCT urinalysis dipstick  . POCT Wet Prep [facility])    Meds ordered this encounter  Medications  . ranitidine (ZANTAC) 150 MG tablet    Sig: Take 1 tablet (150 mg total) by mouth 2 (two) times daily.    Dispense:  60 tablet    Refill:  1  . fluconazole (DIFLUCAN) 150 MG tablet    Sig: Take 1 tablet now. Take second tablet in 72 hours (3 days).    Dispense:  2 tablet    Refill:  0     [name], MD,MS,  PGY2 08/16/2015 8:46 AM

## 2015-08-14 LAB — URINE CULTURE
COLONY COUNT: NO GROWTH
ORGANISM ID, BACTERIA: NO GROWTH

## 2015-08-16 DIAGNOSIS — Z202 Contact with and (suspected) exposure to infections with a predominantly sexual mode of transmission: Secondary | ICD-10-CM | POA: Insufficient documentation

## 2015-08-16 DIAGNOSIS — N898 Other specified noninflammatory disorders of vagina: Secondary | ICD-10-CM | POA: Insufficient documentation

## 2015-08-16 NOTE — Assessment & Plan Note (Signed)
As above.

## 2015-08-16 NOTE — Assessment & Plan Note (Signed)
Patient with concerns about vaginal itching. Believes she has a yeast infection but would like a STD check as well. Patient denied discharge, however thick curd-like discharge noted in vaginal vault. Evidence of yeast on wert mount.  - Fluconazole 150mg  Q72 hours x2 doses - STD labs obtained and pending

## 2015-08-17 ENCOUNTER — Ambulatory Visit: Payer: Self-pay | Admitting: Family Medicine

## 2015-08-17 LAB — CERVICOVAGINAL ANCILLARY ONLY
Chlamydia: NEGATIVE
Neisseria Gonorrhea: NEGATIVE

## 2015-08-19 ENCOUNTER — Ambulatory Visit (INDEPENDENT_AMBULATORY_CARE_PROVIDER_SITE_OTHER): Payer: Self-pay | Admitting: Family Medicine

## 2015-08-19 ENCOUNTER — Encounter: Payer: Self-pay | Admitting: Family Medicine

## 2015-08-19 VITALS — BP 148/97 | HR 106 | Temp 98.1°F | Wt 151.0 lb

## 2015-08-19 DIAGNOSIS — K921 Melena: Secondary | ICD-10-CM

## 2015-08-19 DIAGNOSIS — Z113 Encounter for screening for infections with a predominantly sexual mode of transmission: Secondary | ICD-10-CM

## 2015-08-19 LAB — CBC
HCT: 45.2 % (ref 36.0–46.0)
Hemoglobin: 15.3 g/dL — ABNORMAL HIGH (ref 12.0–15.0)
MCH: 30.2 pg (ref 26.0–34.0)
MCHC: 33.8 g/dL (ref 30.0–36.0)
MCV: 89.3 fL (ref 78.0–100.0)
MPV: 10.4 fL (ref 8.6–12.4)
PLATELETS: 354 10*3/uL (ref 150–400)
RBC: 5.06 MIL/uL (ref 3.87–5.11)
RDW: 13.2 % (ref 11.5–15.5)
WBC: 14.2 10*3/uL — ABNORMAL HIGH (ref 4.0–10.5)

## 2015-08-19 LAB — HEMOCCULT GUIAC POC 1CARD (OFFICE): Fecal Occult Blood, POC: NEGATIVE

## 2015-08-19 LAB — POCT H PYLORI SCREEN: H PYLORI SCREEN, POC: NEGATIVE

## 2015-08-19 LAB — HIV ANTIBODY (ROUTINE TESTING W REFLEX): HIV: NONREACTIVE

## 2015-08-19 NOTE — Patient Instructions (Signed)
It was a pleasure seeing you today in our clinic. Today we discussed your black stool. Here is the treatment plan we have discussed and agreed upon together:   - At this time we will not be changing any of your medications. Continue taking your Zantac. - We have drawn some labs which will take a few days to result. If any of these come back positive and I will contact you with the results and any directions deemed necessary at that time. - If you begin to experience significant lightheadedness, dizziness, confusion, or lethargy report to the nearest urgent care or emergency department. - If you begin to experience regular black/tarry stools or nausea and vomiting with a coffee-ground-like substance then please report to the nearest urgent care or emergency department as well.

## 2015-08-19 NOTE — Progress Notes (Signed)
    HPI  CC: Black stool Patient reports she has been using the bathroom more frequently over the past 3 days. This morning had black stool. Loose but not tarry and not diarrhea. Increased gas. Decreased appetite. Straining some. RUQ pain (usual). But overall unchanged.   She endorses some HA and fatigue.  1 cup of coffee/mth; alcohol 6 drinks/wknd; no recent tobacco use.   ROS: Denies dizziness confusion shortness of breath chest pain vomiting changes in abdominal pain dysuria urinary frequency/hesitancy hematochezia weakness numbness paresthesias   Objective: BP 148/97 mmHg  Pulse 106  Temp(Src) 98.1 F (36.7 C) (Oral)  Wt 151 lb (68.493 kg) Gen: NAD, alert, cooperative, and pleasant. HEENT: NCAT, EOMI, PERRL, no conjunctival pallor CV: RRR, no murmur Resp: CTAB, no wheezes, non-labored Abd: SNTND, BS present, no guarding or organomegaly Rectal: Adequate sphincter tone, no evidence of hemorrhoids, no masses appreciated, minimal stool on glove, Hemoccult negative, patient tolerated relatively well. Rudi Rummage was present for exam. Ext: No edema, warm Neuro: Alert and oriented, Speech clear, No gross deficits  Assessment and plan:  Melena Patient reported one episode of melena this morning. Hemoccult was negative in clinic. Patient has a history of upper quadrant pain and has been prescribed Protonix in the past. Obtaining H. pylori screen and CBC today. Opting not to treat at this time, will wait for results to return. Patient's pain is well controlled. Most recent stool did not show signs of melena according to the patient.    Orders Placed This Encounter  Procedures  . CBC  . HIV antibody (with reflex)  . RPR  . H.pylori screen, POC    IgG Ab. Screen  . Hemoccult - 1 Card (office)    [name], MD,MS,  PGY2 08/19/2015 5:52 PM

## 2015-08-19 NOTE — Assessment & Plan Note (Signed)
Patient reported one episode of melena this morning. Hemoccult was negative in clinic. Patient has a history of upper quadrant pain and has been prescribed Protonix in the past. Obtaining H. pylori screen and CBC today. Opting not to treat at this time, will wait for results to return. Patient's pain is well controlled. Most recent stool did not show signs of melena according to the patient.

## 2015-08-20 ENCOUNTER — Encounter (HOSPITAL_COMMUNITY): Payer: Self-pay

## 2015-08-20 ENCOUNTER — Emergency Department (HOSPITAL_COMMUNITY)
Admission: EM | Admit: 2015-08-20 | Discharge: 2015-08-20 | Disposition: A | Discharge status: Home / Self Care | Payer: Medicaid Other | Attending: Physician Assistant | Admitting: Physician Assistant

## 2015-08-20 DIAGNOSIS — M549 Dorsalgia, unspecified: Secondary | ICD-10-CM | POA: Insufficient documentation

## 2015-08-20 DIAGNOSIS — Z9889 Other specified postprocedural states: Secondary | ICD-10-CM | POA: Insufficient documentation

## 2015-08-20 DIAGNOSIS — I252 Old myocardial infarction: Secondary | ICD-10-CM | POA: Insufficient documentation

## 2015-08-20 DIAGNOSIS — Z79899 Other long term (current) drug therapy: Secondary | ICD-10-CM | POA: Insufficient documentation

## 2015-08-20 DIAGNOSIS — Z3202 Encounter for pregnancy test, result negative: Secondary | ICD-10-CM | POA: Insufficient documentation

## 2015-08-20 DIAGNOSIS — R531 Weakness: Secondary | ICD-10-CM | POA: Insufficient documentation

## 2015-08-20 DIAGNOSIS — Z8742 Personal history of other diseases of the female genital tract: Secondary | ICD-10-CM | POA: Insufficient documentation

## 2015-08-20 DIAGNOSIS — R51 Headache: Secondary | ICD-10-CM | POA: Insufficient documentation

## 2015-08-20 DIAGNOSIS — R519 Headache, unspecified: Secondary | ICD-10-CM

## 2015-08-20 DIAGNOSIS — R5383 Other fatigue: Secondary | ICD-10-CM | POA: Insufficient documentation

## 2015-08-20 DIAGNOSIS — H53149 Visual discomfort, unspecified: Secondary | ICD-10-CM | POA: Insufficient documentation

## 2015-08-20 DIAGNOSIS — F419 Anxiety disorder, unspecified: Secondary | ICD-10-CM | POA: Insufficient documentation

## 2015-08-20 DIAGNOSIS — Z8619 Personal history of other infectious and parasitic diseases: Secondary | ICD-10-CM | POA: Insufficient documentation

## 2015-08-20 DIAGNOSIS — Z87891 Personal history of nicotine dependence: Secondary | ICD-10-CM | POA: Insufficient documentation

## 2015-08-20 LAB — CBC WITH DIFFERENTIAL/PLATELET
BASOS PCT: 0 %
Basophils Absolute: 0 10*3/uL (ref 0.0–0.1)
EOS ABS: 0.1 10*3/uL (ref 0.0–0.7)
Eosinophils Relative: 1 %
HEMATOCRIT: 43.5 % (ref 36.0–46.0)
Hemoglobin: 15 g/dL (ref 12.0–15.0)
Lymphocytes Relative: 18 %
Lymphs Abs: 1.9 10*3/uL (ref 0.7–4.0)
MCH: 30.3 pg (ref 26.0–34.0)
MCHC: 34.5 g/dL (ref 30.0–36.0)
MCV: 87.9 fL (ref 78.0–100.0)
MONO ABS: 0.8 10*3/uL (ref 0.1–1.0)
Monocytes Relative: 8 %
NEUTROS ABS: 7.6 10*3/uL (ref 1.7–7.7)
NEUTROS PCT: 73 %
Platelets: 310 10*3/uL (ref 150–400)
RBC: 4.95 MIL/uL (ref 3.87–5.11)
RDW: 13.1 % (ref 11.5–15.5)
WBC: 10.5 10*3/uL (ref 4.0–10.5)

## 2015-08-20 LAB — I-STAT CHEM 8, ED
BUN: 9 mg/dL (ref 6–20)
CALCIUM ION: 1.18 mmol/L (ref 1.12–1.23)
CREATININE: 0.5 mg/dL (ref 0.44–1.00)
Chloride: 103 mmol/L (ref 101–111)
Glucose, Bld: 112 mg/dL — ABNORMAL HIGH (ref 65–99)
HEMATOCRIT: 49 % — AB (ref 36.0–46.0)
HEMOGLOBIN: 16.7 g/dL — AB (ref 12.0–15.0)
Potassium: 4 mmol/L (ref 3.5–5.1)
Sodium: 139 mmol/L (ref 135–145)
TCO2: 22 mmol/L (ref 0–100)

## 2015-08-20 LAB — URINALYSIS, ROUTINE W REFLEX MICROSCOPIC
Bilirubin Urine: NEGATIVE
Glucose, UA: NEGATIVE mg/dL
Hgb urine dipstick: NEGATIVE
KETONES UR: NEGATIVE mg/dL
NITRITE: NEGATIVE
PROTEIN: NEGATIVE mg/dL
Specific Gravity, Urine: 1.016 (ref 1.005–1.030)
UROBILINOGEN UA: 0.2 mg/dL (ref 0.0–1.0)
pH: 5 (ref 5.0–8.0)

## 2015-08-20 LAB — URINE MICROSCOPIC-ADD ON

## 2015-08-20 LAB — POC URINE PREG, ED: PREG TEST UR: NEGATIVE

## 2015-08-20 LAB — RPR

## 2015-08-20 MED ORDER — PROCHLORPERAZINE EDISYLATE 5 MG/ML IJ SOLN
10.0000 mg | Freq: Four times a day (QID) | INTRAMUSCULAR | Status: DC | PRN
  Administered 2015-08-20: 10 mg via INTRAMUSCULAR
  Filled 2015-08-20: qty 2

## 2015-08-20 MED ORDER — DIPHENHYDRAMINE HCL 50 MG/ML IJ SOLN
25.0000 mg | Freq: Once | INTRAMUSCULAR | Status: AC
  Administered 2015-08-20: 25 mg via INTRAMUSCULAR
  Filled 2015-08-20: qty 1

## 2015-08-20 MED ORDER — KETOROLAC TROMETHAMINE 60 MG/2ML IM SOLN
60.0000 mg | Freq: Once | INTRAMUSCULAR | Status: AC
  Administered 2015-08-20: 60 mg via INTRAMUSCULAR
  Filled 2015-08-20: qty 2

## 2015-08-20 NOTE — ED Notes (Signed)
Pt complains of the worse headache she's ever had, no history of migraines and no OTC meds are working, pt states that she had vomited once

## 2015-08-20 NOTE — ED Provider Notes (Signed)
CSN: [id number]     Arrival date & time 08/20/15  2637 History   First MD Initiated Contact with Patient 08/20/15 0602     Chief Complaint  Patient presents with  . Headache     (Consider location/radiation/quality/duration/timing/severity/associated sxs/prior Treatment) HPI Selena Peterson is a 24 y.o. female presents to emergency department complaining of headache. Patient states her headache started yesterday morning. States constant since then. States headache is mainly frontal, but now radiates to the back as well. Reports associated photophobia. Denies changes in vision. Denies any dizziness. Denies any nasal congestion or sore throat. She states she actually went to see primary care doctor because she had episode of dark stools yesterday and some back spasms. She said "everything checked out okay." She denies any abdominal pain or chest pain. She has been taking ibuprofen and Tylenol yesterday for this pain with no improvement. Patient states that within the helped her headache yesterday was her anxiety medication. She denies any recent head injuries. No fever or chills. No neck pain or stiffness. Denies any focal neurological complaint.  Past Medical History  Diagnosis Date  . Chlamydia   . BV (bacterial vaginosis)   . Anxiety   . MI (myocardial infarction) ([place])   . Elevated troponin 03/19/2014   Past Surgical History  Procedure Laterality Date  . Ankle fracture surgery    . Cardiac catheterization    . Left heart catheterization with coronary angiogram N/A 03/20/2014    Procedure: LEFT HEART CATHETERIZATION WITH CORONARY ANGIOGRAM;  Surgeon: Burnell Blanks, MD;  Location: [facility] CATH LAB;  Service: Cardiovascular;  Laterality: N/A;  . Wisdom tooth extraction     Family History  Problem Relation Age of Onset  . Diabetes Maternal Grandmother   . Hypertension Maternal Grandmother   . GER disease Mother   . Heart failure Maternal Grandmother    Social History  Substance  Use Topics  . Smoking status: Former Smoker -- 0.30 packs/day for 4 years    Types: Cigarettes    Quit date: 09/16/2014  . Smokeless tobacco: None  . Alcohol Use: Yes     Comment: occ shot   OB History    Gravida Para Term Preterm AB TAB SAB Ectopic Multiple Living   1              Review of Systems  Constitutional: Positive for fatigue. Negative for fever and chills.  Respiratory: Negative for cough, chest tightness and shortness of breath.   Cardiovascular: Negative for chest pain, palpitations and leg swelling.  Gastrointestinal: Negative for nausea, vomiting, abdominal pain and diarrhea.  Genitourinary: Negative for dysuria, flank pain and pelvic pain.  Musculoskeletal: Positive for back pain. Negative for myalgias, neck pain and neck stiffness.  Skin: Negative for rash.  Neurological: Positive for weakness and headaches. Negative for dizziness, tremors, syncope, facial asymmetry, speech difficulty and numbness.  All other systems reviewed and are negative.     Allergies  Review of patient's allergies indicates no known allergies.  Home Medications   Prior to Admission medications   Medication Sig Start Date End Date Taking? Authorizing Provider  acetaminophen (TYLENOL) 500 MG tablet Take 1,000 mg by mouth every 6 (six) hours as needed for moderate pain.   Yes Historical Provider, MD  diltiazem (CARDIZEM CD) 120 MG 24 hr capsule Take 1 capsule (120 mg total) by mouth daily. 06/27/15  Yes Delsa Grana, PA-C  etonogestrel (NEXPLANON) 68 MG IMPL implant 1 each by Subdermal route once.  Yes Historical Provider, MD  ibuprofen (ADVIL,MOTRIN) 200 MG tablet Take 400 mg by mouth every 6 (six) hours as needed for moderate pain.   Yes Historical Provider, MD  amitriptyline (ELAVIL) 25 MG tablet Take 1 tablet (25 mg total) by mouth at bedtime. Patient not taking: Reported on 06/27/2015 04/14/15   Timmothy Euler, MD  fluconazole (DIFLUCAN) 150 MG tablet Take 1 tablet now. Take second  tablet in 72 hours (3 days). Patient not taking: Reported on 08/20/2015 08/13/15   [name], MD  hydrOXYzine (ATARAX/VISTARIL) 25 MG tablet Take 1 tablet (25 mg total) by mouth every 6 (six) hours. Patient not taking: Reported on 08/20/2015 06/27/15   Delsa Grana, PA-C  medroxyPROGESTERone (PROVERA) 10 MG tablet Take 1 tablet (10 mg total) by mouth daily. Patient not taking: Reported on 08/20/2015 07/09/15   Waldemar Dickens, MD  pantoprazole (PROTONIX) 20 MG tablet Take 1 tablet (20 mg total) by mouth daily. Patient not taking: Reported on 08/20/2015 06/27/15   Delsa Grana, PA-C  ranitidine (ZANTAC) 150 MG tablet Take 1 tablet (150 mg total) by mouth 2 (two) times daily. Patient not taking: Reported on 08/20/2015 08/13/15   [name], MD   BP 130/87 mmHg  Pulse 85  Temp(Src) 97.6 F (36.4 C) (Oral)  Resp 19  Ht 5' (1.524 m)  Wt 151 lb (68.493 kg)  BMI 29.49 kg/m2  SpO2 97% Physical Exam  Constitutional: She is oriented to person, place, and time. She appears well-developed and well-nourished. No distress.  HENT:  Head: Normocephalic.  Eyes: Conjunctivae and EOM are normal. Pupils are equal, round, and reactive to light.  Neck: Normal range of motion. Neck supple.  Cardiovascular: Normal rate, regular rhythm and normal heart sounds.   Pulmonary/Chest: Effort normal and breath sounds normal. No respiratory distress. She has no wheezes. She has no rales.  Abdominal: Soft. Bowel sounds are normal. She exhibits no distension. There is no tenderness. There is no rebound.  Musculoskeletal: She exhibits no edema.  Neurological: She is alert and oriented to person, place, and time. No cranial nerve deficit. Coordination normal.  5/5 and equal upper and lower extremity strength bilaterally. Equal grip strength bilaterally. Normal finger to nose and heel to shin. No pronator drift.   Skin: Skin is warm and dry.  Psychiatric: She has a normal mood and affect. Her behavior is normal.  Nursing  note and vitals reviewed.   ED Course  Procedures (including critical care time) Labs Review Labs Reviewed  URINALYSIS, ROUTINE W REFLEX MICROSCOPIC (NOT AT [facility]) - Abnormal; Notable for the following:    APPearance CLOUDY (*)    Leukocytes, UA SMALL (*)    All other components within normal limits  I-STAT CHEM 8, ED - Abnormal; Notable for the following:    Glucose, Bld 112 (*)    Hemoglobin 16.7 (*)    HCT 49.0 (*)    All other components within normal limits  CBC WITH DIFFERENTIAL/PLATELET  URINE MICROSCOPIC-ADD ON  POC URINE PREG, ED    Imaging Review No results found. I have personally reviewed and evaluated these images and lab results as part of my medical decision-making.   EKG Interpretation None      MDM   Final diagnoses:  Nonintractable headache, unspecified chronicity pattern, unspecified headache type   Pt with persistent headache for a day, states never had similar headache in the past. Also has multiple non specific complaints. Will check cbc, chem 8, ua, preg, will try  a migraine cocktail. No neuro deficits on exam, doubt any intracranial masses or lesions.  7:45 AM Pt feeling much better after migraine cocktail. VS normal. Labs unremarkable. Urine preg neg. Will d/c home. Instructed to make sure she gets enough sleep, follow up with family doctor if not improving. Return precautions discussed.   Filed Vitals:   08/20/15 0439 08/20/15 0703  BP: 130/87 126/76  Pulse: 85 67  Temp: 97.6 F (36.4 C)   TempSrc: Oral   Resp: 19 16  Height: 5' (1.524 m)   Weight: 151 lb (68.493 kg)   SpO2: 97% 99%     Jeannett Senior, PA-C 08/20/15 [place], MD 08/21/15 [phone]

## 2015-08-20 NOTE — Discharge Instructions (Signed)
Take ibuprofen or Tylenol for the headache as needed. Follow-up with primary care doctor for recheck.   General Headache Without Cause A headache is pain or discomfort felt around the head or neck area. The specific cause of a headache may not be found. There are many causes and types of headaches. A few common ones are:  Tension headaches.  Migraine headaches.  Cluster headaches.  Chronic daily headaches. HOME CARE INSTRUCTIONS  Watch your condition for any changes. Take these steps to help with your condition: Managing Pain  Take over-the-counter and prescription medicines only as told by your health care provider.  Lie down in a dark, quiet room when you have a headache.  If directed, apply ice to the head and neck area:  Put ice in a plastic bag.  Place a towel between your skin and the bag.  Leave the ice on for 20 minutes, 2-3 times per day.  Use a heating pad or hot shower to apply heat to the head and neck area as told by your health care provider.  Keep lights dim if bright lights bother you or make your headaches worse. Eating and Drinking  Eat meals on a regular schedule.  Limit alcohol use.  Decrease the amount of caffeine you drink, or stop drinking caffeine. General Instructions  Keep all follow-up visits as told by your health care provider. This is important.  Keep a headache journal to help find out what may trigger your headaches. For example, write down:  What you eat and drink.  How much sleep you get.  Any change to your diet or medicines.  Try massage or other relaxation techniques.  Limit stress.  Sit up straight, and do not tense your muscles.  Do not use tobacco products, including cigarettes, chewing tobacco, or e-cigarettes. If you need help quitting, ask your health care provider.  Exercise regularly as told by your health care provider.  Sleep on a regular schedule. Get 7-9 hours of sleep, or the amount recommended by your  health care provider. SEEK MEDICAL CARE IF:   Your symptoms are not helped by medicine.  You have a headache that is different from the usual headache.  You have nausea or you vomit.  You have a fever. SEEK IMMEDIATE MEDICAL CARE IF:   Your headache becomes severe.  You have repeated vomiting.  You have a stiff neck.  You have a loss of vision.  You have problems with speech.  You have pain in the eye or ear.  You have muscular weakness or loss of muscle control.  You lose your balance or have trouble walking.  You feel faint or pass out.  You have confusion.   This information is not intended to replace advice given to you by your health care provider. Make sure you discuss any questions you have with your health care provider.   Document Released: 10/09/2005 Document Revised: 06/30/2015 Document Reviewed: 02/01/2015 Elsevier Interactive Patient Education [employer].

## 2015-08-23 ENCOUNTER — Encounter: Payer: Self-pay | Admitting: Family Medicine

## 2015-11-27 ENCOUNTER — Encounter (HOSPITAL_COMMUNITY): Payer: Self-pay | Admitting: Oncology

## 2015-11-27 ENCOUNTER — Emergency Department (HOSPITAL_COMMUNITY)
Admission: EM | Admit: 2015-11-27 | Discharge: 2015-11-28 | Disposition: A | Discharge status: Home / Self Care | Payer: Medicaid Other | Attending: Emergency Medicine | Admitting: Emergency Medicine

## 2015-11-27 DIAGNOSIS — I252 Old myocardial infarction: Secondary | ICD-10-CM | POA: Insufficient documentation

## 2015-11-27 DIAGNOSIS — Z8742 Personal history of other diseases of the female genital tract: Secondary | ICD-10-CM | POA: Insufficient documentation

## 2015-11-27 DIAGNOSIS — Z3202 Encounter for pregnancy test, result negative: Secondary | ICD-10-CM | POA: Insufficient documentation

## 2015-11-27 DIAGNOSIS — Z8619 Personal history of other infectious and parasitic diseases: Secondary | ICD-10-CM | POA: Insufficient documentation

## 2015-11-27 DIAGNOSIS — R079 Chest pain, unspecified: Secondary | ICD-10-CM | POA: Insufficient documentation

## 2015-11-27 DIAGNOSIS — Z9889 Other specified postprocedural states: Secondary | ICD-10-CM | POA: Insufficient documentation

## 2015-11-27 DIAGNOSIS — F419 Anxiety disorder, unspecified: Secondary | ICD-10-CM | POA: Insufficient documentation

## 2015-11-27 DIAGNOSIS — Z87891 Personal history of nicotine dependence: Secondary | ICD-10-CM | POA: Insufficient documentation

## 2015-11-27 LAB — CBC
HCT: 39.4 % (ref 36.0–46.0)
Hemoglobin: 13.4 g/dL (ref 12.0–15.0)
MCH: 29.8 pg (ref 26.0–34.0)
MCHC: 34 g/dL (ref 30.0–36.0)
MCV: 87.8 fL (ref 78.0–100.0)
PLATELETS: 286 10*3/uL (ref 150–400)
RBC: 4.49 MIL/uL (ref 3.87–5.11)
RDW: 13.4 % (ref 11.5–15.5)
WBC: 12 10*3/uL — AB (ref 4.0–10.5)

## 2015-11-27 NOTE — ED Notes (Signed)
Pt c/o SOB, CP and anxiety.  Pt has EMS out to her house and a 12 lead was taken.  Paramedics informed pt that it appeared to be a panic attack.  Pt is speaking in full sentences.  In NAD.

## 2015-11-27 NOTE — ED Notes (Signed)
Nurse drawing labs. 

## 2015-11-28 ENCOUNTER — Emergency Department (HOSPITAL_COMMUNITY): Payer: Medicaid Other

## 2015-11-28 LAB — I-STAT TROPONIN, ED
TROPONIN I, POC: 0.01 ng/mL (ref 0.00–0.08)
Troponin i, poc: 0 ng/mL (ref 0.00–0.08)

## 2015-11-28 LAB — I-STAT CHEM 8, ED
BUN: 8 mg/dL (ref 6–20)
CHLORIDE: 104 mmol/L (ref 101–111)
CREATININE: 0.6 mg/dL (ref 0.44–1.00)
Calcium, Ion: 1.18 mmol/L (ref 1.12–1.23)
Glucose, Bld: 126 mg/dL — ABNORMAL HIGH (ref 65–99)
HCT: 44 % (ref 36.0–46.0)
Hemoglobin: 15 g/dL (ref 12.0–15.0)
POTASSIUM: 3.1 mmol/L — AB (ref 3.5–5.1)
SODIUM: 142 mmol/L (ref 135–145)
TCO2: 22 mmol/L (ref 0–100)

## 2015-11-28 LAB — I-STAT BETA HCG BLOOD, ED (MC, WL, AP ONLY): I-stat hCG, quantitative: <5 m[IU]/mL (ref <5)

## 2015-11-28 NOTE — Discharge Instructions (Signed)
Nonspecific Chest Pain  °Chest pain can be caused by many different conditions. There is always a chance that your pain could be related to something serious, such as a heart attack or a blood clot in your lungs. Chest pain can also be caused by conditions that are not life-threatening. If you have chest pain, it is very important to follow up with your health care provider. °CAUSES  °Chest pain can be caused by: °· Heartburn. °· Pneumonia or bronchitis. °· Anxiety or stress. °· Inflammation around your heart (pericarditis) or lung (pleuritis or pleurisy). °· A blood clot in your lung. °· A collapsed lung (pneumothorax). It can develop suddenly on its own (spontaneous pneumothorax) or from trauma to the chest. °· Shingles infection (varicella-zoster virus). °· Heart attack. °· Damage to the bones, muscles, and cartilage that make up your chest wall. This can include: °¨ Bruised bones due to injury. °¨ Strained muscles or cartilage due to frequent or repeated coughing or overwork. °¨ Fracture to one or more ribs. °¨ Sore cartilage due to inflammation (costochondritis). °RISK FACTORS  °Risk factors for chest pain may include: °· Activities that increase your risk for trauma or injury to your chest. °· Respiratory infections or conditions that cause frequent coughing. °· Medical conditions or overeating that can cause heartburn. °· Heart disease or family history of heart disease. °· Conditions or health behaviors that increase your risk of developing a blood clot. °· Having had chicken pox (varicella zoster). °SIGNS AND SYMPTOMS °Chest pain can feel like: °· Burning or tingling on the surface of your chest or deep in your chest. °· Crushing, pressure, aching, or squeezing pain. °· Dull or sharp pain that is worse when you move, cough, or take a deep breath. °· Pain that is also felt in your back, neck, shoulder, or arm, or pain that spreads to any of these areas. °Your chest pain may come and go, or it may stay  constant. °DIAGNOSIS °Lab tests or other studies may be needed to find the cause of your pain. Your health care provider may have you take a test called an ambulatory ECG (electrocardiogram). An ECG records your heartbeat patterns at the time the test is performed. You may also have other tests, such as: °· Transthoracic echocardiogram (TTE). During echocardiography, sound waves are used to create a picture of all of the heart structures and to look at how blood flows through your heart. °· Transesophageal echocardiogram (TEE). This is a more advanced imaging test that obtains images from inside your body. It allows your health care provider to see your heart in finer detail. °· Cardiac monitoring. This allows your health care provider to monitor your heart rate and rhythm in real time. °· Holter monitor. This is a portable device that records your heartbeat and can help to diagnose abnormal heartbeats. It allows your health care provider to track your heart activity for several days, if needed. °· Stress tests. These can be done through exercise or by taking medicine that makes your heart beat more quickly. °· Blood tests. °· Imaging tests. °TREATMENT  °Your treatment depends on what is causing your chest pain. Treatment may include: °· Medicines. These may include: °¨ Acid blockers for heartburn. °¨ Anti-inflammatory medicine. °¨ Pain medicine for inflammatory conditions. °¨ Antibiotic medicine, if an infection is present. °¨ Medicines to dissolve blood clots. °¨ Medicines to treat coronary artery disease. °· Supportive care for conditions that do not require medicines. This may include: °¨ Resting. °¨ Applying heat   or cold packs to injured areas. °¨ Limiting activities until pain decreases. °HOME CARE INSTRUCTIONS °· If you were prescribed an antibiotic medicine, finish it all even if you start to feel better. °· Avoid any activities that bring on chest pain. °· Do not use any tobacco products, including  cigarettes, chewing tobacco, or electronic cigarettes. If you need help quitting, ask your health care provider. °· Do not drink alcohol. °· Take medicines only as directed by your health care provider. °· Keep all follow-up visits as directed by your health care provider. This is important. This includes any further testing if your chest pain does not go away. °· If heartburn is the cause for your chest pain, you may be told to keep your head raised (elevated) while sleeping. This reduces the chance that acid will go from your stomach into your esophagus. °· Make lifestyle changes as directed by your health care provider. These may include: °¨ Getting regular exercise. Ask your health care provider to suggest some activities that are safe for you. °¨ Eating a heart-healthy diet. A registered dietitian can help you to learn healthy eating options. °¨ Maintaining a healthy weight. °¨ Managing diabetes, if necessary. °¨ Reducing stress. °SEEK MEDICAL CARE IF: °· Your chest pain does not go away after treatment. °· You have a rash with blisters on your chest. °· You have a fever. °SEEK IMMEDIATE MEDICAL CARE IF:  °· Your chest pain is worse. °· You have an increasing cough, or you cough up blood. °· You have severe abdominal pain. °· You have severe weakness. °· You faint. °· You have chills. °· You have sudden, unexplained chest discomfort. °· You have sudden, unexplained discomfort in your arms, back, neck, or jaw. °· You have shortness of breath at any time. °· You suddenly start to sweat, or your skin gets clammy. °· You feel nauseous or you vomit. °· You suddenly feel light-headed or dizzy. °· Your heart begins to beat quickly, or it feels like it is skipping beats. °These symptoms may represent a serious problem that is an emergency. Do not wait to see if the symptoms will go away. Get medical help right away. Call your local emergency services (911 in the [place] Do not drive yourself to the hospital. °  °This  information is not intended to replace advice given to you by your health care provider. Make sure you discuss any questions you have with your health care provider. °  °Document Released: 07/19/2005 Document Revised: 10/30/2014 Document Reviewed: 05/15/2014 °Elsevier Interactive Patient Education ©2016 Elsevier Inc. ° °

## 2015-11-28 NOTE — ED Provider Notes (Signed)
CSN: [id number]     Arrival date & time 11/27/15  2315 History   First MD Initiated Contact with Patient 11/28/15 0007     Chief Complaint  Patient presents with  . Anxiety  . Chest Pain     (Consider location/radiation/quality/duration/timing/severity/associated sxs/prior Treatment) HPI   The patient presents to the emergency Department with complaints of chest pain. She tells me that she had a heart attack in 2015 with admitted to the hospital for a while. Reviewing the charts it was noted that the patient did have an elevated troponin but was taken to the Cath Lab and her arteries were clean. She is supposed to be on diltiazem, amitriptyline and hydroxyzine but does not take these medications and has not for the past few months. She reports being a patient of [facility]. She states that she had been drinking the night before and concerned that maybe she is dehydrated. She reports having acute onset of some shortness of breath, chest pain and feeling anxious and called paramedics who took an EKG which was normal. Patient asked to be transported to the hospital. She states that the symptoms lasted about 30 minutes and then resolved on their own. Denies any exertional component. No diaphoresis, nausea or vomiting.  Past Medical History  Diagnosis Date  . Chlamydia   . BV (bacterial vaginosis)   . Anxiety   . MI (myocardial infarction) ([place])   . Elevated troponin 03/19/2014   Past Surgical History  Procedure Laterality Date  . Ankle fracture surgery    . Cardiac catheterization    . Left heart catheterization with coronary angiogram N/A 03/20/2014    Procedure: LEFT HEART CATHETERIZATION WITH CORONARY ANGIOGRAM;  Surgeon: Burnell Blanks, MD;  Location: [facility] CATH LAB;  Service: Cardiovascular;  Laterality: N/A;  . Wisdom tooth extraction     Family History  Problem Relation Age of Onset  . Diabetes Maternal Grandmother   . Hypertension Maternal Grandmother   . GER  disease Mother   . Heart failure Maternal Grandmother    Social History  Substance Use Topics  . Smoking status: Former Smoker -- 0.30 packs/day for 4 years    Types: Cigarettes    Quit date: 09/16/2014  . Smokeless tobacco: Never Used  . Alcohol Use: Yes     Comment: occ shot   OB History    Gravida Para Term Preterm AB TAB SAB Ectopic Multiple Living   1              Review of Systems  Review of Systems All other systems negative except as documented in the HPI. All pertinent positives and negatives as reviewed in the HPI.   Allergies  Review of patient's allergies indicates no known allergies.  Home Medications   Prior to Admission medications   Medication Sig Start Date End Date Taking? Authorizing Provider  acetaminophen (TYLENOL) 500 MG tablet Take 1,000 mg by mouth every 6 (six) hours as needed for moderate pain.   Yes Historical Provider, MD  amitriptyline (ELAVIL) 25 MG tablet Take 1 tablet (25 mg total) by mouth at bedtime. Patient not taking: Reported on 06/27/2015 04/14/15   Timmothy Euler, MD  diltiazem (CARDIZEM CD) 120 MG 24 hr capsule Take 1 capsule (120 mg total) by mouth daily. Patient not taking: Reported on 11/27/2015 06/27/15   Delsa Grana, PA-C  etonogestrel (NEXPLANON) 68 MG IMPL implant 1 each by Subdermal route once.    Historical Provider, MD  fluconazole (DIFLUCAN)  150 MG tablet Take 1 tablet now. Take second tablet in 72 hours (3 days). Patient not taking: Reported on 08/20/2015 08/13/15   [name], MD  hydrOXYzine (ATARAX/VISTARIL) 25 MG tablet Take 1 tablet (25 mg total) by mouth every 6 (six) hours. Patient not taking: Reported on 08/20/2015 06/27/15   Delsa Grana, PA-C  medroxyPROGESTERone (PROVERA) 10 MG tablet Take 1 tablet (10 mg total) by mouth daily. Patient not taking: Reported on 08/20/2015 07/09/15   Waldemar Dickens, MD  pantoprazole (PROTONIX) 20 MG tablet Take 1 tablet (20 mg total) by mouth daily. Patient not taking: Reported on  08/20/2015 06/27/15   Delsa Grana, PA-C  ranitidine (ZANTAC) 150 MG tablet Take 1 tablet (150 mg total) by mouth 2 (two) times daily. Patient not taking: Reported on 08/20/2015 08/13/15   [name], MD   BP 136/91 mmHg  Pulse 79  Temp(Src) 98.3 F (36.8 C) (Oral)  Resp 20  Ht 5' (1.524 m)  Wt 68.04 kg  BMI 29.30 kg/m2  SpO2 98%  LMP 10/28/2015 (Approximate) Physical Exam  Constitutional: She appears well-developed and well-nourished. No distress.  HENT:  Head: Normocephalic and atraumatic.  Right Ear: Tympanic membrane and ear canal normal.  Left Ear: Tympanic membrane and ear canal normal.  Nose: Nose normal.  Mouth/Throat: Uvula is midline, oropharynx is clear and moist and mucous membranes are normal.  Eyes: Pupils are equal, round, and reactive to light.  Neck: Normal range of motion. Neck supple.  Cardiovascular: Normal rate and regular rhythm.   Pulmonary/Chest: Effort normal.  Abdominal: Soft.  No signs of abdominal distention  Musculoskeletal:  No LE swelling  Neurological: She is alert.  Acting at baseline  Skin: Skin is warm and dry. No rash noted.  Nursing note and vitals reviewed.   ED Course  Procedures (including critical care time) Labs Review Labs Reviewed  CBC - Abnormal; Notable for the following:    WBC 12.0 (*)    All other components within normal limits  I-STAT CHEM 8, ED - Abnormal; Notable for the following:    Potassium 3.1 (*)    Glucose, Bld 126 (*)    All other components within normal limits  I-STAT TROPOININ, ED  I-STAT BETA HCG BLOOD, ED ([facility], [facility], [facility] ONLY)  I-STAT TROPOININ, ED    Imaging Review Dg Chest 2 View  11/28/2015  CLINICAL DATA:  Left-sided chest pain and shortness of breath for 1 hour. EXAM: CHEST  2 VIEW COMPARISON:  06/27/2015 FINDINGS: The cardiomediastinal contours are normal. The lungs are clear. Pulmonary vasculature is normal. No consolidation, pleural effusion, or pneumothorax. No acute osseous abnormalities are  seen. IMPRESSION: No acute pulmonary process. Electronically Signed   By: Jeb Levering M.D.   On: 11/28/2015 00:24   I have personally reviewed and evaluated these images and lab results as part of my medical decision-making.   EKG Interpretation   Date/Time:  Saturday November 27 2015 23:28:28 EST Ventricular Rate:  87 PR Interval:  172 QRS Duration: 86 QT Interval:  375 QTC Calculation: 451 R Axis:   72 Text Interpretation:  Sinus rhythm Confirmed by [facility]  MD, APRIL  ([id number]) on 11/28/2015 12:22:49 AM      MDM   Final diagnoses:  Chest pain, unspecified chest pain type    Patient is to be discharged with recommendation to follow up with PCP in regards to today's hospital visit. Chest pain is not likely of cardiac or pulmonary etiology d/t presentation, perc negative,  VSS, no tracheal deviation, no JVD or new murmur, RRR, breath sounds equal bilaterally, EKG without acute abnormalities, negative troponin, and negative CXR. Pt has been advised start a PPI and return to the ED is CP becomes exertional, associated with diaphoresis or nausea, radiates to left jaw/arm, worsens or becomes concerning in any way. Pt appears reliable for follow up and is agreeable to discharge.   Case has been discussed with Dr. Randal Buba who agrees with the above plan to discharge.    Delos Haring, PA-C 11/28/15 0221  April Palumbo, MD 11/28/15 [phone]

## 2015-12-10 ENCOUNTER — Ambulatory Visit (INDEPENDENT_AMBULATORY_CARE_PROVIDER_SITE_OTHER): Payer: Self-pay | Admitting: Family Medicine

## 2015-12-10 ENCOUNTER — Other Ambulatory Visit (HOSPITAL_COMMUNITY)
Admission: RE | Admit: 2015-12-10 | Discharge: 2015-12-10 | Disposition: A | Discharge status: Home / Self Care | Payer: Medicaid Other | Source: Ambulatory Visit | Attending: Family Medicine | Admitting: Family Medicine

## 2015-12-10 VITALS — BP 131/93 | HR 99 | Temp 97.8°F | Wt 151.6 lb

## 2015-12-10 DIAGNOSIS — Z113 Encounter for screening for infections with a predominantly sexual mode of transmission: Secondary | ICD-10-CM | POA: Insufficient documentation

## 2015-12-10 DIAGNOSIS — R102 Pelvic and perineal pain: Secondary | ICD-10-CM

## 2015-12-10 DIAGNOSIS — N9489 Other specified conditions associated with female genital organs and menstrual cycle: Secondary | ICD-10-CM

## 2015-12-10 DIAGNOSIS — N760 Acute vaginitis: Secondary | ICD-10-CM | POA: Insufficient documentation

## 2015-12-10 DIAGNOSIS — N926 Irregular menstruation, unspecified: Secondary | ICD-10-CM

## 2015-12-10 LAB — POCT UA - MICROSCOPIC ONLY

## 2015-12-10 LAB — POCT URINALYSIS DIPSTICK
BILIRUBIN UA: NEGATIVE
GLUCOSE UA: NEGATIVE
KETONES UA: NEGATIVE
LEUKOCYTES UA: NEGATIVE
Nitrite, UA: NEGATIVE
PH UA: 5.5
Protein, UA: NEGATIVE
Spec Grav, UA: 1.025
Urobilinogen, UA: 0.2

## 2015-12-10 LAB — POCT URINE PREGNANCY: PREG TEST UR: NEGATIVE

## 2015-12-10 NOTE — Patient Instructions (Signed)
Testing for infections Checking ultrasound  I'll call you or send letter with results  Be well, Dr. Ardelia Mems

## 2015-12-12 NOTE — Progress Notes (Signed)
Date of Visit: 12/10/2015   HPI:  Patient presents for a same day appointment to discuss pressure in pelvic area. Noticed it first about a week ago. Feels pressure in LLQ area. At first thought it was gas pressure, but has had normal bowel movements and flatus. Does not have pain, just pressure. No dysuria or frequency. Has had nexplanon for about one year. Periods are just starting to become more regular. Began having vaginal bleeding yesterday. Sexually active with one female partner. Does not use condoms.    ROS: See HPI  [place]: history of tobacco abuse, NSTEMI  PHYSICAL EXAM: BP 131/93 mmHg  Pulse 99  Temp(Src) 97.8 F (36.6 C) (Oral)  Wt 151 lb 9.6 oz (68.765 kg)  LMP 10/28/2015 (Approximate) Gen: NAD, pleasant, cooperative HEENT: NCAT Abdomen: soft, nontender to palpation, no masses palpated GU: normal appearing external genitalia without lesions. Vagina is moist with menstrual blood present. Cervix normal in appearance. No cervical motion tenderness or tenderness on bimanual exam, but does feel pressure with palpation of left adnexal area. No adnexal masses.   ASSESSMENT/PLAN:  1. Pelvic pressure - etiology unclear but differential diagnosis includes STD, ovarian cyst, gas pain, among other things. Doubt PID with no cervical motion tenderness. Urine pregnancy test negative today. Urinalysis unremarkable, suggests against UTI.  Plan: - test for gc/chl/trich today - ultrasound of pelvis to evaluate left adnexa  FOLLOW UP: Follow up pending results  [place] J. Ardelia Mems, [place]

## 2015-12-13 LAB — CERVICOVAGINAL ANCILLARY ONLY
Chlamydia: NEGATIVE
NEISSERIA GONORRHEA: NEGATIVE
Trichomonas: NEGATIVE

## 2015-12-17 ENCOUNTER — Ambulatory Visit (HOSPITAL_COMMUNITY): Payer: Medicaid Other

## 2015-12-22 ENCOUNTER — Ambulatory Visit (HOSPITAL_COMMUNITY)
Admission: RE | Admit: 2015-12-22 | Discharge: 2015-12-22 | Disposition: A | Discharge status: Home / Self Care | Payer: Medicaid Other | Source: Ambulatory Visit | Attending: Family Medicine | Admitting: Family Medicine

## 2015-12-22 ENCOUNTER — Telehealth: Payer: Self-pay | Admitting: Family Medicine

## 2015-12-22 DIAGNOSIS — N926 Irregular menstruation, unspecified: Secondary | ICD-10-CM | POA: Insufficient documentation

## 2015-12-22 DIAGNOSIS — R102 Pelvic and perineal pain: Secondary | ICD-10-CM

## 2015-12-22 DIAGNOSIS — D259 Leiomyoma of uterus, unspecified: Secondary | ICD-10-CM | POA: Insufficient documentation

## 2015-12-22 NOTE — Telephone Encounter (Signed)
Called patient to discuss results. U/s showed one small uterine fibroid but no other findings Std tests were negative No explanation for the pressure she's had  Does report having back pains over the last few nights. No current fever. Recommend she follow up with PCP to discuss the back pain. Could consider CT scan vs MRI if pelvic pressure is persistent. Patient appreciative.  She is thinking about switching from nexplanon to birth control pills. Note that I would NOT put her on estrogen-containing birth control due to her history of NSTEMI. Progesterone therapy would also need risk-benefit discussion.  Leeanne Rio, MD

## 2015-12-24 ENCOUNTER — Ambulatory Visit: Payer: Self-pay | Admitting: Family Medicine

## 2015-12-27 ENCOUNTER — Ambulatory Visit: Payer: Self-pay | Admitting: Family Medicine

## 2016-01-31 ENCOUNTER — Encounter: Payer: Self-pay | Admitting: Family Medicine

## 2016-01-31 ENCOUNTER — Ambulatory Visit (INDEPENDENT_AMBULATORY_CARE_PROVIDER_SITE_OTHER): Payer: Medicaid Other | Admitting: Family Medicine

## 2016-01-31 VITALS — BP 110/66 | HR 89 | Temp 98.5°F | Ht 60.0 in | Wt 146.2 lb

## 2016-01-31 DIAGNOSIS — N760 Acute vaginitis: Secondary | ICD-10-CM | POA: Diagnosis not present

## 2016-01-31 DIAGNOSIS — R58 Hemorrhage, not elsewhere classified: Secondary | ICD-10-CM

## 2016-01-31 DIAGNOSIS — Z113 Encounter for screening for infections with a predominantly sexual mode of transmission: Secondary | ICD-10-CM | POA: Diagnosis not present

## 2016-01-31 DIAGNOSIS — A499 Bacterial infection, unspecified: Secondary | ICD-10-CM | POA: Diagnosis not present

## 2016-01-31 DIAGNOSIS — Z3040 Encounter for surveillance of contraceptives, unspecified: Secondary | ICD-10-CM

## 2016-01-31 DIAGNOSIS — Z3046 Encounter for surveillance of implantable subdermal contraceptive: Secondary | ICD-10-CM

## 2016-01-31 LAB — POCT URINE PREGNANCY: Preg Test, Ur: NEGATIVE

## 2016-01-31 MED ORDER — NORETHINDRONE 0.35 MG PO TABS: 1.0000 | ORAL_TABLET | Freq: Every day | ORAL | Status: AC

## 2016-01-31 NOTE — Assessment & Plan Note (Signed)
Nexplanon removed today per patient request. NOT a candidate for estrogen-containing contraception given her history of NSTEMI. Discussed risks/benefits of progesterone only oral contraception. Patient prefers this. Discussed need for back up contraception for the next week, and needing to take pill at same time each day. Patient understands risks of progesterone therapy, wishes to proceed with this rx.

## 2016-01-31 NOTE — Patient Instructions (Signed)
Keep the pressure dressing on for 24 hours After that you can shower Steri strip will fall off on its own  Sent in birth control pills for you Use backup condoms for 7 days  Be well, Dr. Ardelia Mems   Oral Contraception Information Oral contraceptive pills (OCPs) are medicines taken to prevent pregnancy. OCPs work by preventing the ovaries from releasing eggs. The hormones in OCPs also cause the cervical mucus to thicken, preventing the sperm from entering the uterus. The hormones also cause the uterine lining to become thin, not allowing a fertilized egg to attach to the inside of the uterus. OCPs are highly effective when taken exactly as prescribed. However, OCPs do not prevent sexually transmitted diseases (STDs). Safe sex practices, such as using condoms along with the pill, can help prevent STDs.  Before taking the pill, you may have a physical exam and Pap test. Your health care provider may order blood tests. The health care provider will make sure you are a good candidate for oral contraception. Discuss with your health care provider the possible side effects of the OCP you may be prescribed. When starting an OCP, it can take 2 to 3 months for the body to adjust to the changes in hormone levels in your body.  TYPES OF ORAL CONTRACEPTION  The combination pill--This pill contains estrogen and progestin (synthetic progesterone) hormones. The combination pill comes in 21-day, 28-day, or 91-day packs. Some types of combination pills are meant to be taken continuously (365-day pills). With 21-day packs, you do not take pills for 7 days after the last pill. With 28-day packs, the pill is taken every day. The last 7 pills are without hormones. Certain types of pills have more than 21 hormone-containing pills. With 91-day packs, the first 84 pills contain both hormones, and the last 7 pills contain no hormones or contain estrogen only.  The minipill--This pill contains the progesterone hormone only.  The pill is taken every day continuously. It is very important to take the pill at the same time each day. The minipill comes in packs of 28 pills. All 28 pills contain the hormone.  ADVANTAGES OF ORAL CONTRACEPTIVE PILLS  Decreases premenstrual symptoms.   Treats menstrual period cramps.   Regulates the menstrual cycle.   Decreases a heavy menstrual flow.   May treatacne, depending on the type of pill.   Treats abnormal uterine bleeding.   Treats polycystic ovarian syndrome.   Treats endometriosis.   Can be used as emergency contraception.  THINGS THAT CAN MAKE ORAL CONTRACEPTIVE PILLS LESS EFFECTIVE OCPs can be less effective if:   You forget to take the pill at the same time every day.   You have a stomach or intestinal disease that lessens the absorption of the pill.   You take OCPs with other medicines that make OCPs less effective, such as antibiotics, certain HIV medicines, and some seizure medicines.   You take expired OCPs.   You forget to restart the pill on day 7, when using the packs of 21 pills.  RISKS ASSOCIATED WITH ORAL CONTRACEPTIVE PILLS  Oral contraceptive pills can sometimes cause side effects, such as:  Headache.  Nausea.  Breast tenderness.  Irregular bleeding or spotting. Combination pills are also associated with a small increased risk of:  Blood clots.  Heart attack.  Stroke.   This information is not intended to replace advice given to you by your health care provider. Make sure you discuss any questions you have with your health care  provider.   Document Released: 12/30/2002 Document Revised: 07/30/2013 Document Reviewed: 03/30/2013 Elsevier Interactive Patient Education [employer].

## 2016-01-31 NOTE — Progress Notes (Signed)
Date of Visit: 01/31/2016   HPI:  Patient presents today for nexplanon removal. She has had the current nexplanon in for about one year. Dislikes the irregular bleeding associated with it. Has had nexplanon prior to this one in the past. Wants it removed and to switch to birth control pills.   History of NSTEMI discussed with patient. She was admitted in May 2015 with chest pain, and had elevated troponin. Underwent cardiac catheterization which showed no CAD. Troponin elevation thought to be due to coronary vasospasm or myopericarditis. Has not followed up with cardiology recently. Plans to get new job with good insurance soon, and then will follow up with cardiology. No recent heart issues. She is currently sexually active and needs something for pregnancy prevention.  ROS: See HPI.  [place]: history of NSTEMI  PHYSICAL EXAM: BP 110/66 mmHg  Pulse 89  Temp(Src) 98.5 F (36.9 C) (Oral)  Ht 5' (1.524 m)  Wt 146 lb 3.2 oz (66.316 kg)  BMI 28.55 kg/m2 Gen: NAD, pleasant, cooperative Extremities: LUE with easily palpable nexplanon in upper arm  PROCEDURE NOTE: Nexplanon Removal  Patient given informed consent for removal of her Nexplanon.  Signed copy in the chart.  Time out recorded. Implanon site identified and able to be palpated. Skin cleaned with alcohol. One cc of 1% lidocaine was used to anesthetize the area at the distal end of the implant.  Area prepped in usual sterile fashion A small stab incision was made near implant on the distal portion.   The implanon rod was grasped using hemostats and removed without difficulty.   Rod measured at 4cm to ensure complete removal. Steri-strips were applied over the small incision.   A pressure bandage was applied to reduce any bruising.  There was less than 3 cc blood loss. There were no complications.  The patient tolerated the procedure well and was given post procedure instructions.    ASSESSMENT/PLAN:  Contraception Nexplanon  removed today per patient request. NOT a candidate for estrogen-containing contraception given her history of NSTEMI. Discussed risks/benefits of progesterone only oral contraception. Patient prefers this. Discussed need for back up contraception for the next week, and needing to take pill at same time each day. Patient understands risks of progesterone therapy, wishes to proceed with this rx.    [place]. Ardelia Mems, [place]

## 2016-02-01 ENCOUNTER — Encounter (HOSPITAL_COMMUNITY): Payer: Self-pay | Admitting: Emergency Medicine

## 2016-02-01 ENCOUNTER — Ambulatory Visit (HOSPITAL_COMMUNITY)
Admission: EM | Admit: 2016-02-01 | Discharge: 2016-02-01 | Disposition: A | Discharge status: Home / Self Care | Payer: Medicaid Other | Attending: Family Medicine | Admitting: Family Medicine

## 2016-02-01 DIAGNOSIS — J302 Other seasonal allergic rhinitis: Secondary | ICD-10-CM

## 2016-02-01 MED ORDER — METHYLPREDNISOLONE ACETATE 80 MG/ML IJ SUSP
INTRAMUSCULAR | Status: AC
  Filled 2016-02-01: qty 1

## 2016-02-01 MED ORDER — FEXOFENADINE HCL 180 MG PO TABS: 180.0000 mg | ORAL_TABLET | Freq: Every day | ORAL | Status: AC

## 2016-02-01 MED ORDER — METHYLPREDNISOLONE ACETATE 80 MG/ML IJ SUSP
80.0000 mg | Freq: Once | INTRAMUSCULAR | Status: AC
  Administered 2016-02-01: 80 mg via INTRAMUSCULAR

## 2016-02-01 MED ORDER — FLUTICASONE PROPIONATE 50 MCG/ACT NA SUSP: 1.0000 | Freq: Two times a day (BID) | NASAL | Status: AC

## 2016-02-01 NOTE — ED Notes (Signed)
Here with sinus/allergy sx's that started this morning at work Sore throat,runny nose, congestions and now body aches Tried Tylenol

## 2016-02-01 NOTE — ED Provider Notes (Signed)
CSN: [id number]     Arrival date & time 02/01/16  1553 History   None    Chief Complaint  Patient presents with  . Sinus Problem  . Allergies   (Consider location/radiation/quality/duration/timing/severity/associated sxs/prior Treatment) Patient is a 25 y.o. female presenting with sinus complaint. The history is provided by the patient.  Sinus Problem This is a new problem. The current episode started 6 to 12 hours ago. The problem has been gradually worsening. Pertinent negatives include no chest pain, no abdominal pain, no headaches and no shortness of breath. The symptoms are aggravated by sneezing.    Past Medical History  Diagnosis Date  . Chlamydia   . BV (bacterial vaginosis)   . Anxiety   . MI (myocardial infarction) ([place])   . Elevated troponin 03/19/2014   Past Surgical History  Procedure Laterality Date  . Ankle fracture surgery    . Cardiac catheterization    . Left heart catheterization with coronary angiogram N/A 03/20/2014    Procedure: LEFT HEART CATHETERIZATION WITH CORONARY ANGIOGRAM;  Surgeon: Burnell Blanks, MD;  Location: [facility] CATH LAB;  Service: Cardiovascular;  Laterality: N/A;  . Wisdom tooth extraction     Family History  Problem Relation Age of Onset  . Diabetes Maternal Grandmother   . Hypertension Maternal Grandmother   . GER disease Mother   . Heart failure Maternal Grandmother    Social History  Substance Use Topics  . Smoking status: Former Smoker -- 0.30 packs/day for 4 years    Types: Cigarettes    Quit date: 09/16/2014  . Smokeless tobacco: Never Used  . Alcohol Use: Yes     Comment: occ shot   OB History    Gravida Para Term Preterm AB TAB SAB Ectopic Multiple Living   1              Review of Systems  Constitutional: Negative.   HENT: Positive for congestion, postnasal drip and sneezing.   Respiratory: Positive for cough. Negative for shortness of breath.   Cardiovascular: Negative for chest pain.  Gastrointestinal:  Negative for abdominal pain.  Neurological: Negative for headaches.  All other systems reviewed and are negative.   Allergies  Review of patient's allergies indicates no known allergies.  Home Medications   Prior to Admission medications   Medication Sig Start Date End Date Taking? Authorizing Provider  acetaminophen (TYLENOL) 500 MG tablet Take 1,000 mg by mouth every 6 (six) hours as needed for moderate pain.    Historical Provider, MD  amitriptyline (ELAVIL) 25 MG tablet Take 1 tablet (25 mg total) by mouth at bedtime. Patient not taking: Reported on 06/27/2015 04/14/15   Timmothy Euler, MD  diltiazem (CARDIZEM CD) 120 MG 24 hr capsule Take 1 capsule (120 mg total) by mouth daily. Patient not taking: Reported on 11/27/2015 06/27/15   Delsa Grana, PA-C  etonogestrel (NEXPLANON) 68 MG IMPL implant 1 each by Subdermal route once.    Historical Provider, MD  fluconazole (DIFLUCAN) 150 MG tablet Take 1 tablet now. Take second tablet in 72 hours (3 days). Patient not taking: Reported on 08/20/2015 08/13/15   [name], MD  hydrOXYzine (ATARAX/VISTARIL) 25 MG tablet Take 1 tablet (25 mg total) by mouth every 6 (six) hours. Patient not taking: Reported on 08/20/2015 06/27/15   Delsa Grana, PA-C  medroxyPROGESTERone (PROVERA) 10 MG tablet Take 1 tablet (10 mg total) by mouth daily. Patient not taking: Reported on 08/20/2015 07/09/15   Waldemar Dickens, MD  norethindrone (  MICRONOR,CAMILA,ERRIN) 0.35 MG tablet Take 1 tablet (0.35 mg total) by mouth daily. 01/31/16   Leeanne Rio, MD  pantoprazole (PROTONIX) 20 MG tablet Take 1 tablet (20 mg total) by mouth daily. Patient not taking: Reported on 08/20/2015 06/27/15   Delsa Grana, PA-C  ranitidine (ZANTAC) 150 MG tablet Take 1 tablet (150 mg total) by mouth 2 (two) times daily. Patient not taking: Reported on 08/20/2015 08/13/15   [name], MD   Meds Ordered and Administered this Visit   Medications  methylPREDNISolone acetate (DEPO-MEDROL)  injection 80 mg (not administered)    BP 125/83 mmHg  Pulse 85  Temp(Src) 98.8 F (37.1 C) (Oral)  Resp 16  SpO2 100%  LMP 01/29/2016 No data found.   Physical Exam  Constitutional: She is oriented to person, place, and time. She appears well-developed and well-nourished. No distress.  HENT:  Right Ear: External ear normal.  Left Ear: External ear normal.  Nose: Mucosal edema and rhinorrhea present.  Mouth/Throat: Oropharynx is clear and moist.  Neck: Normal range of motion. Neck supple.  Cardiovascular: Normal rate, regular rhythm, normal heart sounds and intact distal pulses.   Pulmonary/Chest: Effort normal and breath sounds normal.  Lymphadenopathy:    She has no cervical adenopathy.  Neurological: She is alert and oriented to person, place, and time.  Skin: Skin is warm and dry.  Nursing note and vitals reviewed.   ED Course  Procedures (including critical care time)  Labs Review Labs Reviewed - No data to display  Imaging Review No results found.   Visual Acuity Review  Right Eye Distance:   Left Eye Distance:   Bilateral Distance:    Right Eye Near:   Left Eye Near:    Bilateral Near:         MDM  No diagnosis found.  Meds ordered this encounter  Medications  . methylPREDNISolone acetate (DEPO-MEDROL) injection 80 mg    Sig:   . fluticasone (FLONASE) 50 MCG/ACT nasal spray    Sig: Place 1 spray into both nostrils 2 (two) times daily.    Dispense:  16 g    Refill:  2  . fexofenadine (ALLEGRA) 180 MG tablet    Sig: Take 1 tablet (180 mg total) by mouth daily.    Dispense:  30 tablet    Refill:  1      Billy Fischer, MD 02/01/16 [id number]

## 2016-02-02 ENCOUNTER — Ambulatory Visit (INDEPENDENT_AMBULATORY_CARE_PROVIDER_SITE_OTHER): Payer: Self-pay | Admitting: Family Medicine

## 2016-02-02 VITALS — BP 132/84 | HR 88 | Temp 100.3°F | Wt 148.1 lb

## 2016-02-02 DIAGNOSIS — J069 Acute upper respiratory infection, unspecified: Secondary | ICD-10-CM

## 2016-02-02 DIAGNOSIS — R509 Fever, unspecified: Secondary | ICD-10-CM

## 2016-02-02 DIAGNOSIS — J029 Acute pharyngitis, unspecified: Secondary | ICD-10-CM

## 2016-02-02 LAB — INFLUENZA PANEL BY PCR (TYPE A & B)
H1N1FLUPCR: NOT DETECTED
INFLAPCR: NEGATIVE
INFLBPCR: NEGATIVE

## 2016-02-02 LAB — POCT RAPID STREP A (OFFICE): Rapid Strep A Screen: NEGATIVE

## 2016-02-02 MED ORDER — OSELTAMIVIR PHOSPHATE 75 MG PO CAPS: 75.0000 mg | ORAL_CAPSULE | Freq: Two times a day (BID) | ORAL | Status: DC

## 2016-02-02 MED ORDER — BENZONATATE 200 MG PO CAPS: 200.0000 mg | ORAL_CAPSULE | Freq: Two times a day (BID) | ORAL | Status: DC | PRN

## 2016-02-02 MED ORDER — BENZONATATE 200 MG PO CAPS: 200.0000 mg | ORAL_CAPSULE | Freq: Two times a day (BID) | ORAL | Status: AC | PRN

## 2016-02-02 NOTE — Patient Instructions (Signed)
Cough Treatment - you should: - Take the Tamiflu which was prescribed today twice a day over the next 5 days. - For your cough I have provided Tessalon Perles. Take this twice a day as needed for any cough. If your cough improves do not have to take this. - Stay well-hydrated. - Ibuprofen or Tylenol for your fever and body aches.  You should be better in: 7 to 10 days Call us if you have severe shortness of breath, high fever or are not better in 2 weeks

## 2016-02-02 NOTE — Progress Notes (Signed)
COUGH  Has been coughing for 2 days. Cough is: dry Sputum production: none Medications tried: tylenol, theraflu Taking blood pressure medications: yes  Symptoms Runny nose: yes Mucous in back of throat: no Throat burning or reflux: yes Wheezing or asthma: no Fever: yes Chest Pain: no Shortness of breath: no Leg swelling: no Hemoptysis: no Weight loss: no  ROS see HPI Smoking Status noted  CC, SH/smoking status, and VS noted  Objective: BP 132/84 mmHg  Pulse 88  Temp(Src) 100.3 F (37.9 C) (Oral)  Wt 148 lb 1.6 oz (67.178 kg)  LMP 01/29/2016 Gen: NAD, alert, cooperative, and pleasant. Slightly diaphoretic during exam. HEENT: NCAT, EOMI, PERRL, OP erythematous without exudate, clear rhinorrhea present, no LAD. TMs with bilateral effusions present. CV: RRR, no murmur Resp: CTAB, no wheezes, non-labored  Assessment and plan:  URI (upper respiratory infection) Patient presents with signs and symptoms consistent with URI. Etiology currently unknown however signs and symptoms most consistent with possible influenza infection. Patient did not have flu vaccine this season. Fever, chills, body aches, and upper respiratory symptoms suggestive of this diagnosis. Rapid strep test was negative. No evidence of exudate making strep pharyngitis less likely. Other viral etiology cannot be ruled out at this time. - Rapid strep test: Negative - Influenza PCR: Pending - Patient is within the 48-hour window of symptom onset. I have sent Tamiflu and instructed her to take this point a day for the next 5 days. - Tessalon Perles for cough - I encouraged her to stay well-hydrated with water and/or Gatorade. - Acetaminophen/IV Profen for body aches, fever, discomfort.    Orders Placed This Encounter  Procedures  . Influenza panel by PCR (type A & B, H1N1)  . POCT rapid strep A    Meds ordered this encounter  Medications  . DISCONTD: oseltamivir (TAMIFLU) 75 MG capsule    Sig: Take 1  capsule (75 mg total) by mouth 2 (two) times daily.    Dispense:  10 capsule    Refill:  0  . DISCONTD: benzonatate (TESSALON) 200 MG capsule    Sig: Take 1 capsule (200 mg total) by mouth 2 (two) times daily as needed for cough.    Dispense:  20 capsule    Refill:  0  . oseltamivir (TAMIFLU) 75 MG capsule    Sig: Take 1 capsule (75 mg total) by mouth 2 (two) times daily.    Dispense:  10 capsule    Refill:  0  . benzonatate (TESSALON) 200 MG capsule    Sig: Take 1 capsule (200 mg total) by mouth 2 (two) times daily as needed for cough.    Dispense:  20 capsule    Refill:  0     [name], MD,MS,  PGY2 02/02/2016 6:03 PM

## 2016-02-02 NOTE — Assessment & Plan Note (Signed)
Patient presents with signs and symptoms consistent with URI. Etiology currently unknown however signs and symptoms most consistent with possible influenza infection. Patient did not have flu vaccine this season. Fever, chills, body aches, and upper respiratory symptoms suggestive of this diagnosis. Rapid strep test was negative. No evidence of exudate making strep pharyngitis less likely. Other viral etiology cannot be ruled out at this time. - Rapid strep test: Negative - Influenza PCR: Pending - Patient is within the 48-hour window of symptom onset. I have sent Tamiflu and instructed her to take this point a day for the next 5 days. - Tessalon Perles for cough - I encouraged her to stay well-hydrated with water and/or Gatorade. - Acetaminophen/IV Profen for body aches, fever, discomfort.

## 2016-02-03 ENCOUNTER — Telehealth: Payer: Self-pay | Admitting: Family Medicine

## 2016-02-03 MED ORDER — AMOXICILLIN-POT CLAVULANATE 875-125 MG PO TABS: 1.0000 | ORAL_TABLET | Freq: Two times a day (BID) | ORAL | Status: AC

## 2016-02-03 NOTE — Telephone Encounter (Signed)
Called patient w/ swab results. Flu negative. Asked to DC tamiflu and sent prescription for augmentin.

## 2016-04-18 ENCOUNTER — Telehealth: Payer: Self-pay | Admitting: Family Medicine

## 2016-04-18 NOTE — Telephone Encounter (Signed)
She needs to be seen.

## 2016-04-18 NOTE — Telephone Encounter (Signed)
Would like refill on [place]. Will be traveling to [place] this weekend and knows she will need it because [place].  Please let pt know if this can be done or if she needs to be seen

## 2016-04-20 NOTE — Telephone Encounter (Signed)
LMOVM for pt to call us back. Deseree Blount, CMA  

## 2016-08-09 ENCOUNTER — Other Ambulatory Visit: Payer: Self-pay | Admitting: Family Medicine

## 2016-08-09 NOTE — Telephone Encounter (Signed)
Pt needs a refill on Cardizem. Pt is using [facility] on [address] in [place]. Please advise. Thanks! ep

## 2016-08-10 MED ORDER — DILTIAZEM HCL ER COATED BEADS 120 MG PO CP24: 120.0000 mg | ORAL_CAPSULE | Freq: Every day | ORAL | 11 refills | Status: AC

## 2017-08-11 ENCOUNTER — Emergency Department (HOSPITAL_COMMUNITY): Payer: No Typology Code available for payment source

## 2017-08-11 ENCOUNTER — Emergency Department (HOSPITAL_COMMUNITY)
Admission: EM | Admit: 2017-08-11 | Discharge: 2017-08-11 | Disposition: A | Discharge status: Home / Self Care | Payer: No Typology Code available for payment source | Attending: Emergency Medicine | Admitting: Emergency Medicine

## 2017-08-11 ENCOUNTER — Encounter (HOSPITAL_COMMUNITY): Payer: Self-pay | Admitting: Emergency Medicine

## 2017-08-11 DIAGNOSIS — M549 Dorsalgia, unspecified: Secondary | ICD-10-CM | POA: Insufficient documentation

## 2017-08-11 DIAGNOSIS — Z87891 Personal history of nicotine dependence: Secondary | ICD-10-CM | POA: Diagnosis not present

## 2017-08-11 DIAGNOSIS — R1030 Lower abdominal pain, unspecified: Secondary | ICD-10-CM | POA: Diagnosis present

## 2017-08-11 DIAGNOSIS — R197 Diarrhea, unspecified: Secondary | ICD-10-CM | POA: Diagnosis not present

## 2017-08-11 LAB — CBC WITH DIFFERENTIAL/PLATELET
Basophils Absolute: 0 10*3/uL (ref 0.0–0.1)
Basophils Relative: 0 %
Eosinophils Absolute: 0.1 10*3/uL (ref 0.0–0.7)
Eosinophils Relative: 1 %
HEMATOCRIT: 47.6 % — AB (ref 36.0–46.0)
HEMOGLOBIN: 16.3 g/dL — AB (ref 12.0–15.0)
LYMPHS ABS: 1.3 10*3/uL (ref 0.7–4.0)
LYMPHS PCT: 18 %
MCH: 30.3 pg (ref 26.0–34.0)
MCHC: 34.2 g/dL (ref 30.0–36.0)
MCV: 88.5 fL (ref 78.0–100.0)
Monocytes Absolute: 0.7 10*3/uL (ref 0.1–1.0)
Monocytes Relative: 10 %
NEUTROS PCT: 71 %
Neutro Abs: 5 10*3/uL (ref 1.7–7.7)
Platelets: 328 10*3/uL (ref 150–400)
RBC: 5.38 MIL/uL — AB (ref 3.87–5.11)
RDW: 13.9 % (ref 11.5–15.5)
WBC: 7.1 10*3/uL (ref 4.0–10.5)

## 2017-08-11 LAB — BASIC METABOLIC PANEL
Anion gap: 9 (ref 5–15)
BUN: 8 mg/dL (ref 6–20)
CALCIUM: 8.9 mg/dL (ref 8.9–10.3)
CHLORIDE: 107 mmol/L (ref 101–111)
CO2: 20 mmol/L — ABNORMAL LOW (ref 22–32)
Creatinine, Ser: 0.59 mg/dL (ref 0.44–1.00)
GFR calc Af Amer: >60 mL/min (ref >60)
GLUCOSE: 101 mg/dL — AB (ref 65–99)
POTASSIUM: 4 mmol/L (ref 3.5–5.1)
Sodium: 136 mmol/L (ref 135–145)

## 2017-08-11 LAB — POC URINE PREG, ED: Preg Test, Ur: NEGATIVE

## 2017-08-11 MED ORDER — LOPERAMIDE HCL 2 MG PO CAPS: 2.0000 mg | ORAL_CAPSULE | Freq: Four times a day (QID) | ORAL | 0 refills | Status: AC | PRN

## 2017-08-11 MED ORDER — SODIUM CHLORIDE 0.9 % IV BOLUS (SEPSIS)
1000.0000 mL | Freq: Once | INTRAVENOUS | Status: AC
  Administered 2017-08-11: 1000 mL via INTRAVENOUS
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
## 2017-08-11 NOTE — Discharge Instructions (Signed)
Drink plenty of fluids, rest, take the imodium to help with the diarrhea
